# Patient Record
Sex: Female | Born: 1975 | Race: Black or African American | Hispanic: No | Smoking: Never smoker
Health system: Southern US, Community
[De-identification: ages and names within clinical notes are randomized; demographics above are authoritative.]

## PROBLEM LIST (undated history)

## (undated) DIAGNOSIS — Z789 Other specified health status: Secondary | ICD-10-CM

## (undated) DIAGNOSIS — IMO0002 Reserved for concepts with insufficient information to code with codable children: Secondary | ICD-10-CM

## (undated) DIAGNOSIS — N852 Hypertrophy of uterus: Secondary | ICD-10-CM

## (undated) DIAGNOSIS — N760 Acute vaginitis: Secondary | ICD-10-CM

## (undated) DIAGNOSIS — B009 Herpesviral infection, unspecified: Secondary | ICD-10-CM

## (undated) DIAGNOSIS — B9689 Other specified bacterial agents as the cause of diseases classified elsewhere: Secondary | ICD-10-CM

## (undated) DIAGNOSIS — N946 Dysmenorrhea, unspecified: Secondary | ICD-10-CM

## (undated) DIAGNOSIS — A749 Chlamydial infection, unspecified: Secondary | ICD-10-CM

## (undated) DIAGNOSIS — L0390 Cellulitis, unspecified: Secondary | ICD-10-CM

## (undated) HISTORY — DX: Hypertrophy of uterus: N85.2

## (undated) HISTORY — PX: OTHER SURGICAL HISTORY: SHX169

## (undated) HISTORY — PX: TUBAL LIGATION: SHX77

## (undated) HISTORY — DX: Chlamydial infection, unspecified: A74.9

## (undated) HISTORY — DX: Dysmenorrhea, unspecified: N94.6

## (undated) HISTORY — DX: Other specified bacterial agents as the cause of diseases classified elsewhere: N76.0

## (undated) HISTORY — DX: Herpesviral infection, unspecified: B00.9

## (undated) HISTORY — DX: Cellulitis, unspecified: L03.90

## (undated) HISTORY — PX: ABDOMINAL HYSTERECTOMY: SHX81

## (undated) HISTORY — DX: Acute vaginitis: B96.89

## (undated) HISTORY — DX: Reserved for concepts with insufficient information to code with codable children: IMO0002

---

## 1998-08-04 ENCOUNTER — Other Ambulatory Visit: Admission: RE | Admit: 1998-08-04 | Discharge: 1998-08-04 | Payer: Self-pay | Admitting: Obstetrics & Gynecology

## 1998-09-09 ENCOUNTER — Ambulatory Visit (HOSPITAL_COMMUNITY): Admission: RE | Admit: 1998-09-09 | Discharge: 1998-09-09 | Payer: Self-pay | Admitting: Obstetrics & Gynecology

## 1999-04-05 ENCOUNTER — Other Ambulatory Visit: Admission: RE | Admit: 1999-04-05 | Discharge: 1999-04-05 | Payer: Self-pay | Admitting: Obstetrics and Gynecology

## 1999-08-08 ENCOUNTER — Encounter (INDEPENDENT_AMBULATORY_CARE_PROVIDER_SITE_OTHER): Payer: Self-pay | Admitting: Specialist

## 1999-08-08 ENCOUNTER — Other Ambulatory Visit: Admission: RE | Admit: 1999-08-08 | Discharge: 1999-08-08 | Payer: Self-pay | Admitting: Obstetrics & Gynecology

## 1999-09-12 ENCOUNTER — Ambulatory Visit (HOSPITAL_COMMUNITY): Admission: RE | Admit: 1999-09-12 | Discharge: 1999-09-12 | Payer: Self-pay | Admitting: Obstetrics & Gynecology

## 1999-09-12 ENCOUNTER — Encounter (INDEPENDENT_AMBULATORY_CARE_PROVIDER_SITE_OTHER): Payer: Self-pay | Admitting: Specialist

## 2000-02-03 ENCOUNTER — Other Ambulatory Visit: Admission: RE | Admit: 2000-02-03 | Discharge: 2000-02-03 | Payer: Self-pay | Admitting: Obstetrics & Gynecology

## 2000-03-12 ENCOUNTER — Emergency Department (HOSPITAL_COMMUNITY): Admission: EM | Admit: 2000-03-12 | Discharge: 2000-03-12 | Payer: Self-pay | Admitting: Emergency Medicine

## 2000-06-04 ENCOUNTER — Other Ambulatory Visit: Admission: RE | Admit: 2000-06-04 | Discharge: 2000-06-04 | Payer: Self-pay | Admitting: Obstetrics & Gynecology

## 2000-08-24 ENCOUNTER — Inpatient Hospital Stay (HOSPITAL_COMMUNITY): Admission: AD | Admit: 2000-08-24 | Discharge: 2000-08-28 | Payer: Self-pay

## 2001-09-09 ENCOUNTER — Other Ambulatory Visit: Admission: RE | Admit: 2001-09-09 | Discharge: 2001-09-09 | Payer: Self-pay | Admitting: Obstetrics and Gynecology

## 2001-09-18 ENCOUNTER — Emergency Department (HOSPITAL_COMMUNITY): Admission: EM | Admit: 2001-09-18 | Discharge: 2001-09-18 | Payer: Self-pay | Admitting: Emergency Medicine

## 2002-06-02 ENCOUNTER — Ambulatory Visit (HOSPITAL_BASED_OUTPATIENT_CLINIC_OR_DEPARTMENT_OTHER): Admission: RE | Admit: 2002-06-02 | Discharge: 2002-06-02 | Payer: Self-pay | Admitting: Specialist

## 2002-06-02 ENCOUNTER — Encounter (INDEPENDENT_AMBULATORY_CARE_PROVIDER_SITE_OTHER): Payer: Self-pay | Admitting: Specialist

## 2002-09-11 ENCOUNTER — Other Ambulatory Visit: Admission: RE | Admit: 2002-09-11 | Discharge: 2002-09-11 | Payer: Self-pay | Admitting: Obstetrics and Gynecology

## 2002-09-11 ENCOUNTER — Other Ambulatory Visit: Admission: RE | Admit: 2002-09-11 | Discharge: 2002-09-11 | Payer: Self-pay | Admitting: *Deleted

## 2003-02-02 ENCOUNTER — Ambulatory Visit (HOSPITAL_BASED_OUTPATIENT_CLINIC_OR_DEPARTMENT_OTHER): Admission: RE | Admit: 2003-02-02 | Discharge: 2003-02-02 | Payer: Self-pay | Admitting: Specialist

## 2003-02-02 ENCOUNTER — Encounter (INDEPENDENT_AMBULATORY_CARE_PROVIDER_SITE_OTHER): Payer: Self-pay | Admitting: *Deleted

## 2003-06-08 ENCOUNTER — Encounter (INDEPENDENT_AMBULATORY_CARE_PROVIDER_SITE_OTHER): Payer: Self-pay | Admitting: *Deleted

## 2003-06-08 ENCOUNTER — Ambulatory Visit (HOSPITAL_BASED_OUTPATIENT_CLINIC_OR_DEPARTMENT_OTHER): Admission: RE | Admit: 2003-06-08 | Discharge: 2003-06-08 | Payer: Self-pay | Admitting: Specialist

## 2003-09-15 ENCOUNTER — Other Ambulatory Visit: Admission: RE | Admit: 2003-09-15 | Discharge: 2003-09-15 | Payer: Self-pay | Admitting: Obstetrics and Gynecology

## 2004-10-18 ENCOUNTER — Other Ambulatory Visit: Admission: RE | Admit: 2004-10-18 | Discharge: 2004-10-18 | Payer: Self-pay | Admitting: Obstetrics and Gynecology

## 2004-10-19 ENCOUNTER — Other Ambulatory Visit: Admission: RE | Admit: 2004-10-19 | Discharge: 2004-10-19 | Payer: Self-pay | Admitting: Obstetrics and Gynecology

## 2005-10-25 ENCOUNTER — Other Ambulatory Visit: Admission: RE | Admit: 2005-10-25 | Discharge: 2005-10-25 | Payer: Self-pay | Admitting: Obstetrics and Gynecology

## 2006-03-08 ENCOUNTER — Encounter: Admission: RE | Admit: 2006-03-08 | Discharge: 2006-03-08 | Payer: Self-pay | Admitting: General Surgery

## 2006-03-30 ENCOUNTER — Encounter (INDEPENDENT_AMBULATORY_CARE_PROVIDER_SITE_OTHER): Payer: Self-pay | Admitting: *Deleted

## 2006-03-30 ENCOUNTER — Ambulatory Visit (HOSPITAL_BASED_OUTPATIENT_CLINIC_OR_DEPARTMENT_OTHER): Admission: RE | Admit: 2006-03-30 | Discharge: 2006-03-30 | Payer: Self-pay | Admitting: General Surgery

## 2006-04-01 ENCOUNTER — Emergency Department (HOSPITAL_COMMUNITY): Admission: EM | Admit: 2006-04-01 | Discharge: 2006-04-02 | Payer: Self-pay | Admitting: Emergency Medicine

## 2006-04-07 ENCOUNTER — Emergency Department (HOSPITAL_COMMUNITY): Admission: EM | Admit: 2006-04-07 | Discharge: 2006-04-07 | Payer: Self-pay | Admitting: Emergency Medicine

## 2009-06-23 ENCOUNTER — Encounter: Admission: RE | Admit: 2009-06-23 | Discharge: 2009-06-23 | Payer: Self-pay | Admitting: Obstetrics and Gynecology

## 2009-08-26 ENCOUNTER — Encounter (INDEPENDENT_AMBULATORY_CARE_PROVIDER_SITE_OTHER): Payer: Self-pay | Admitting: Obstetrics and Gynecology

## 2009-08-26 ENCOUNTER — Inpatient Hospital Stay (HOSPITAL_COMMUNITY): Admission: RE | Admit: 2009-08-26 | Discharge: 2009-08-29 | Payer: Self-pay | Admitting: Obstetrics and Gynecology

## 2009-10-09 DIAGNOSIS — L0390 Cellulitis, unspecified: Secondary | ICD-10-CM

## 2009-10-09 HISTORY — DX: Cellulitis, unspecified: L03.90

## 2009-10-09 HISTORY — PX: REVISION OF ABDOMINAL SCAR: SHX2347

## 2010-11-25 ENCOUNTER — Encounter (HOSPITAL_COMMUNITY)
Admission: RE | Admit: 2010-11-25 | Discharge: 2010-11-25 | Disposition: A | Discharge status: Home / Self Care | Payer: BC Managed Care – PPO | Source: Ambulatory Visit | Attending: Obstetrics and Gynecology | Admitting: Obstetrics and Gynecology

## 2010-11-25 DIAGNOSIS — Z01812 Encounter for preprocedural laboratory examination: Secondary | ICD-10-CM | POA: Insufficient documentation

## 2010-11-25 LAB — CBC
HCT: 37.9 % (ref 36.0–46.0)
Hemoglobin: 12.4 g/dL (ref 12.0–15.0)
MCH: 27.7 pg (ref 26.0–34.0)
Platelets: 282 10*3/uL (ref 150–400)
RBC: 4.47 MIL/uL (ref 3.87–5.11)
WBC: 6.4 10*3/uL (ref 4.0–10.5)

## 2010-11-25 LAB — SURGICAL PCR SCREEN
MRSA, PCR: NEGATIVE
Staphylococcus aureus: NEGATIVE

## 2010-12-01 ENCOUNTER — Other Ambulatory Visit: Payer: Self-pay | Admitting: Obstetrics and Gynecology

## 2010-12-01 ENCOUNTER — Ambulatory Visit (HOSPITAL_COMMUNITY)
Admission: RE | Admit: 2010-12-01 | Discharge: 2010-12-01 | Disposition: A | Discharge status: Home / Self Care | Payer: BC Managed Care – PPO | Source: Ambulatory Visit | Attending: Obstetrics and Gynecology | Admitting: Obstetrics and Gynecology

## 2010-12-01 DIAGNOSIS — O909 Complication of the puerperium, unspecified: Secondary | ICD-10-CM | POA: Insufficient documentation

## 2010-12-11 ENCOUNTER — Inpatient Hospital Stay (HOSPITAL_COMMUNITY)
Admission: AD | Admit: 2010-12-11 | Discharge: 2010-12-11 | Disposition: A | Discharge status: Home / Self Care | Payer: BC Managed Care – PPO | Source: Ambulatory Visit | Attending: Obstetrics and Gynecology | Admitting: Obstetrics and Gynecology

## 2010-12-11 DIAGNOSIS — O99893 Other specified diseases and conditions complicating puerperium: Secondary | ICD-10-CM | POA: Insufficient documentation

## 2010-12-11 DIAGNOSIS — L501 Idiopathic urticaria: Secondary | ICD-10-CM | POA: Insufficient documentation

## 2010-12-11 LAB — DIFFERENTIAL
Lymphs Abs: 1.4 10*3/uL (ref 0.7–4.0)
Monocytes Absolute: 0.4 10*3/uL (ref 0.1–1.0)
Monocytes Relative: 7 % (ref 3–12)
Neutrophils Relative %: 62 % (ref 43–77)

## 2010-12-11 LAB — CBC
HCT: 40.9 % (ref 36.0–46.0)
MCH: 28.2 pg (ref 26.0–34.0)

## 2010-12-24 NOTE — Op Note (Signed)
  NAME:  Judy Baird, FLAUM             ACCOUNT NO.:  [id number]  MEDICAL RECORD NO.:  [id number]           PATIENT TYPE:  O  LOCATION:  [facility]                          [facility]  PHYSICIAN:  Hal Morales, M.D.DATE OF BIRTH:  [birth year]  DATE OF PROCEDURE:  12/01/2010 DATE OF DISCHARGE:                              OPERATIVE REPORT   PREOPERATIVE DIAGNOSIS:  Cyclic incisional tenderness and drainage, rule out endometriosis.  POSTOPERATIVE DIAGNOSIS:  Sub-incisional subcutaneous scarring without specific mass, rule out endometriosis.  DISPOSITION OF SPECIMEN:  Pathology.  DESCRIPTION OF PROCEDURE:  The patient was taken to the operating room after appropriate identification and placed on the operating table. After the attainment of adequate general anesthesia, the abdomen was prepped with ChloraPrep and allowed to dry.  The abdomen was then draped as a sterile field.  Subcutaneous infiltration of the incision with 0.25% Marcaine for a total of 20 mL was undertaken.  The indurated macerated portion of the incision was excised and indurated subcutaneous tissue underneath excised likewise.  Hemostasis was achieved with Bovie cautery and irrigation.  A small superficial defect in the fascia was closed with a running suture of 0 Vicryl.  The subcutaneous tissue was then reapproximated with mattress sutures of 3-0 Vicryl to reduce tension on the skin incision.  The skin incision was then closed with a subcuticular suture of 3-0 Monocryl.  A sterile pressure dressing was applied and the patient awakened from general anesthesia and taken to the recovery room in satisfactory condition, having tolerated the procedure well with sponge and instrument counts correct.  Specimen to Pathology is incisional scar which had been excised.  DISCHARGE INSTRUCTIONS:  The patient was given printed instructions for laparoscopy, however, her wound care will include leaving her dressing in place for  36 hours.  She can then remove the dressing and shower. She is to pat the incision dry and then to use a blow dryer over the incision, dressing it with a very light dressing.  This is primarily for comfort in wearing her cloths, however, when she can leave the incision open to air dry that it is indeed preferable.  Any time that she notices moisture in that area, she is to use the hair dryer to blow dry that moisture.  She will follow up in 1-2 weeks in the office with Dr. Pennie Rushing.  DISCHARGE MEDICATIONS: 1. Ibuprofen 600 mg p.o. q.6 hours for 3 days and then q.6 h p.r.n.     pain. 2. Vicodin 1-2 p.o. q.4 hours p.r.n. severe pain.    Hal Morales, M.D.    VPH/MEDQ  D:  12/01/2010  T:  12/01/2010  Job:  [id number]  Electronically Signed by Dierdre Forth M.D. on 12/24/2010 07:12:41 PM

## 2010-12-24 NOTE — H&P (Signed)
NAME:  Judy Baird, Judy Baird             ACCOUNT NO.:  [id number]  MEDICAL RECORD NO.:  [id number]         PATIENT TYPE:  WAMB  LOCATION:                                [facility]  [facility]  PHYSICIAN:  Hal Morales, M.D.DATE OF BIRTH:  07-Feb-1976  DATE OF ADMISSION:  12/01/2010 DATE OF DISCHARGE:                             HISTORY & PHYSICAL   DATE OF OPERATION:  December 01, 2010.  DATE OF EXAMINATION:  November 10, 2010.  HISTORY OF PRESENT ILLNESS:  The patient is a 34 year old black married female, para 2-0-0-2 who presents for evaluation of continued pain and drainage 16 months after cesarean section.  The patient underwent a repeat low transverse cesarean section in November 2010 without difficulty.  Shortly thereafter, she began to have some drainage from her incision.  There was no apparent defect in the incision; however, local cleaning was performed, and the patient was to continue with the cleaning-drying regimen.  She was seen on several occasions, approximately  1 week apart with what appeared to be good healing of the incision, but continued complaints of draining from the incision.  She had her first normal menstrual cycle on September 30, 2009, for approximately 4 days.  There was noted to be some slight moisture near her incision, but no specific defect in the healing.  She continued to monitor this, and then in January 2011, complained of some areas of soreness, but no further drainage.  She continued to have regular normal menses, but intermittent left lower quadrant pain; unassociated with nausea, vomiting, constipation or diarrhea.  Evaluation of the incision shows that it was completely closed with no drainage, but a very superficial area in the left pole that revealed some skin disruption. There was some induration below the incision.  She was given a 7-day course of Cipro.  She reported that her left lower quadrant pain was completely resolved and that her  menses continued to be normal.  When she was seen almost a year later, she reported having continued problems with her incision with monthly episodes of pain in the incision along with some clear drainage.  She stated that she had had increased pain in that area, especially during her menses over the central portion of her incision.  Further questioning of her past history revealed that she had had 6 years of no conception and no contraception use and had always had severe menstrual cramps.  Concerns about endometriosis were verbalized, and the patient underwent an ultrasound of the incisional area with no finding of any sort of collection of blood or fluid.  She continues to have nausea, vomiting, and severe cramps with her menses and states that her incisional pain is at its worst during her menses.  PAST OBSTETRICAL HISTORY:  The patient had a primary low transverse cesarean section in 2001 for nonreassuring fetal heart rate tracing at approximately 7 cm.  She had a repeat cesarean section in November 2010 as an elective scheduled repeat.  She also had a tubal ligation at that time for sterilization.  GYNECOLOGIC HISTORY:  The patient had a Pap smear showing CIN-2 in 1999 and underwent a  LEEP procedure for that.  Her Pap smears since that time have been normal.  The patient also has a history of Chlamydia treated in 2003.  She denies any other gynecologic illness.  The patient has a history of HSV-2 with infrequent outbreaks.  SURGICAL HISTORY:  In 1996, ACL reconstruction; 1999, arthroscopic knee evaluation on the right.  MEDICAL HISTORY:  The patient has a history of kidney stones with no recent episodes of pain.  The patient also has a history of gestational diabetes with her most recent pregnancy.  FAMILY HISTORY:  Positive for diabetes, hypertension, and cancer.  REVIEW OF SYSTEMS:  As mentioned above, with special attention to dysmenorrhea and cyclic incisional  pain.  PHYSICAL EXAMINATION:  GENERAL:  The patient is a well-developed black female in no acute distress. VITAL SIGNS:  The blood pressure is 102/64. LUNGS:  Clear. HEART:  Regular rate and rhythm. ABDOMEN:  Soft without masses or organomegaly.  There is tenderness over a well-healed suprapubic incision, primarily centrally.  There is no discharge from the incision. PELVIC:  EGBUS within normal limits.  The vagina is rugous.  The cervix is without gross lesions.  The uterus is normal size, shape, consistency, anterior, mobile, and nontender.  Adnexa, no masses.  IMPRESSION: 1. Cyclic menstrual incisional pain without evidence of subcutaneous     mass. 2. Severe dysmenorrhea. 3. Episodes of intermittent drainage from the incision, though none     recent.  DISPOSITION:  A long discussion was held with the patient concerning options for management of her symptoms.  The option of utilization of Mirena for control of dysmenorrhea and observation as to whether this resolved the cyclic incisional pain was discussed.  The patient was also offered a second opinion with Plastic Surgery to determine whether reconstruction of her incision is likely to lead to improvement.  She was also given the option of incisional exploration with excision of any mass and revision of the incision under general anesthesia at same-day surgery.  She had opted for this procedure and will undergo that at [facility] on December 01, 2010.  She understands the risks of anesthesia, bleeding, infection, and damage to adjacent organs.  She also understands the risk that this may not completely resolve the symptoms that she is having.  She does want to proceed.     Hal Morales, M.D.     VPH/MEDQ  D:  11/25/2010  T:  11/26/2010  Job:  [id number]  Electronically Signed by Dierdre Forth M.D. on 12/24/2010 07:12:33 PM

## 2010-12-27 ENCOUNTER — Emergency Department (HOSPITAL_COMMUNITY): Payer: BC Managed Care – PPO

## 2010-12-27 ENCOUNTER — Observation Stay (HOSPITAL_COMMUNITY)
Admission: EM | Admit: 2010-12-27 | Discharge: 2010-12-27 | Disposition: A | Discharge status: Home / Self Care | Payer: BC Managed Care – PPO | Source: Ambulatory Visit | Attending: Emergency Medicine | Admitting: Emergency Medicine

## 2010-12-27 DIAGNOSIS — R112 Nausea with vomiting, unspecified: Principal | ICD-10-CM | POA: Insufficient documentation

## 2010-12-27 LAB — CBC
HCT: 37.7 % (ref 36.0–46.0)
Hemoglobin: 12.9 g/dL (ref 12.0–15.0)
MCH: 28.4 pg (ref 26.0–34.0)
RDW: 12.8 % (ref 11.5–15.5)
WBC: 9.1 10*3/uL (ref 4.0–10.5)

## 2010-12-27 LAB — DIFFERENTIAL
Eosinophils Absolute: 0.1 10*3/uL (ref 0.0–0.7)
Eosinophils Relative: 1 % (ref 0–5)
Lymphs Abs: 1.9 10*3/uL (ref 0.7–4.0)
Monocytes Absolute: 0.9 10*3/uL (ref 0.1–1.0)

## 2010-12-27 LAB — COMPREHENSIVE METABOLIC PANEL
ALT: <8 U/L (ref 0–35)
Albumin: 4 g/dL (ref 3.5–5.2)
Alkaline Phosphatase: 81 U/L (ref 39–117)
BUN: 7 mg/dL (ref 6–23)
CO2: 23 mEq/L (ref 19–32)
Calcium: 9.5 mg/dL (ref 8.4–10.5)
Chloride: 108 mEq/L (ref 96–112)
GFR calc Af Amer: >60 mL/min (ref >60)
GFR calc non Af Amer: >60 mL/min (ref >60)
Glucose, Bld: 126 mg/dL — ABNORMAL HIGH (ref 70–99)
Sodium: 140 mEq/L (ref 135–145)
Total Bilirubin: 0.7 mg/dL (ref 0.3–1.2)
Total Protein: 8.1 g/dL (ref 6.0–8.3)

## 2010-12-27 LAB — URINE MICROSCOPIC-ADD ON

## 2010-12-27 LAB — LIPASE, BLOOD: Lipase: 22 U/L (ref 11–59)

## 2010-12-27 LAB — URINALYSIS, ROUTINE W REFLEX MICROSCOPIC
Specific Gravity, Urine: 1.005 (ref 1.005–1.030)
Urobilinogen, UA: 0.2 mg/dL (ref 0.0–1.0)
pH: 8 (ref 5.0–8.0)

## 2010-12-27 MED ORDER — IOHEXOL 300 MG/ML  SOLN
80.0000 mL | Freq: Once | INTRAMUSCULAR | Status: AC | PRN
  Administered 2010-12-27: 80 mL via INTRAVENOUS

## 2010-12-28 ENCOUNTER — Encounter (HOSPITAL_BASED_OUTPATIENT_CLINIC_OR_DEPARTMENT_OTHER): Payer: BC Managed Care – PPO

## 2011-01-11 LAB — CBC
HCT: 29 % — ABNORMAL LOW (ref 36.0–46.0)
HCT: 33.4 % — ABNORMAL LOW (ref 36.0–46.0)
Hemoglobin: 10.9 g/dL — ABNORMAL LOW (ref 12.0–15.0)
Hemoglobin: 9.5 g/dL — ABNORMAL LOW (ref 12.0–15.0)
MCV: 85.9 fL (ref 78.0–100.0)
Platelets: 279 10*3/uL (ref 150–400)
RBC: 3.35 MIL/uL — ABNORMAL LOW (ref 3.87–5.11)
RDW: 15.5 % (ref 11.5–15.5)
RDW: 15.6 % — ABNORMAL HIGH (ref 11.5–15.5)

## 2011-01-11 LAB — GLUCOSE, CAPILLARY
Glucose-Capillary: 71 mg/dL (ref 70–99)
Glucose-Capillary: 71 mg/dL (ref 70–99)
Glucose-Capillary: 74 mg/dL (ref 70–99)
Glucose-Capillary: 79 mg/dL (ref 70–99)

## 2011-01-11 LAB — BASIC METABOLIC PANEL
CO2: 25 mEq/L (ref 19–32)
Calcium: 8.8 mg/dL (ref 8.4–10.5)
Creatinine, Ser: 0.55 mg/dL (ref 0.4–1.2)
GFR calc Af Amer: >60 mL/min (ref >60)
Glucose, Bld: 56 mg/dL — ABNORMAL LOW (ref 70–99)

## 2011-01-11 LAB — RPR: RPR Ser Ql: NONREACTIVE

## 2011-01-25 ENCOUNTER — Encounter (HOSPITAL_BASED_OUTPATIENT_CLINIC_OR_DEPARTMENT_OTHER): Payer: BC Managed Care – PPO

## 2011-02-01 ENCOUNTER — Encounter (HOSPITAL_BASED_OUTPATIENT_CLINIC_OR_DEPARTMENT_OTHER): Payer: BC Managed Care – PPO | Attending: Plastic Surgery

## 2011-02-01 DIAGNOSIS — L905 Scar conditions and fibrosis of skin: Secondary | ICD-10-CM | POA: Insufficient documentation

## 2011-02-01 DIAGNOSIS — O909 Complication of the puerperium, unspecified: Secondary | ICD-10-CM | POA: Insufficient documentation

## 2011-02-10 ENCOUNTER — Encounter (HOSPITAL_BASED_OUTPATIENT_CLINIC_OR_DEPARTMENT_OTHER): Payer: BC Managed Care – PPO | Attending: Plastic Surgery

## 2011-02-10 DIAGNOSIS — L905 Scar conditions and fibrosis of skin: Secondary | ICD-10-CM | POA: Insufficient documentation

## 2011-02-10 DIAGNOSIS — O909 Complication of the puerperium, unspecified: Secondary | ICD-10-CM | POA: Insufficient documentation

## 2011-02-10 NOTE — H&P (Signed)
  NAME:  Judy Baird, Judy Baird             ACCOUNT NO.:  [id number]  MEDICAL RECORD NO.:  [id number]           PATIENT TYPE:  O  LOCATION:  [facility]                         FACILITY:  [facility]  PHYSICIAN:  Wayland Denis, DO      DATE OF BIRTH:  1976/10/02  DATE OF ADMISSION:  02/01/2011 DATE OF DISCHARGE:                             HISTORY & PHYSICAL   CHIEF COMPLAINT:  C-section wound.  HISTORY OF PRESENT ILLNESS:  Judy Baird is a 35 year old black female who underwent a C-section a year ago and then a revision in February 2012. Since then in the central portion of the incision, there has been an open wound.  She has tried putting different creams on that without improvement and is here for evaluation and suggestions for treatment.  PAST SURGICAL HISTORY: 1. Kidney stones. 2. C-section. 3. Hidradenitis in her axilla. 4. ACL repair.  MEDICATIONS:  Ibuprofen as needed.  ALLERGIES:  No known drug allergies.  FAMILY HISTORY:  Lives at home, has children, and did have gestational diabetes.FAMILY HISTORY:  Noncontributory.  REVIEW OF SYSTEMS:  She denies any recent fevers, infections, change in her vision, blood in her urine or stool, abdominal pain, nausea, vomiting, diarrhea, chest pain, or difficulty breathing.  PHYSICAL EXAMINATION:  GENERAL:  She is alert, oriented, and cooperative in no acute distress and a good historian.  She is pleasant. HEENT:  Her pupils are equal, reactive to light and accommodation. Extraocular muscles are intact. NECK:  No cervical lymphadenopathy. LUNGS:  Clear. HEART:  Regular. ABDOMEN:  Soft and nontender.  The incision for the C-section is healing.  In the very center portion of the incision, there is a 2-mm open area with some thin skin and epithelium around it.  It does not appear to be infected.  Because of the way her abdomen is and the scar, there is some folding over of the tissue which is probably keeping the area moist.  Additionally, any stretch  may be pulling that apart and making it difficult to heal. EXTREMITIES:  Lower extremity pulses are regular.  Good capillary refill.  No deformities noted.  LABORATORY DATA:  From January 25, 2011 is within normal limits.  For her CMP, her hemoglobin A1c is elevated at 5.8.  ASSESSMENT:  Abdominal Cesarean section scar.  PLAN:  Silvercel and triple antibiotic ointments, multivitamin, vitamin C, Dial soap bath twice a day or shower, and follow up in 1 week.     Wayland Denis, DO     CS/MEDQ  D:  02/01/2011  T:  02/02/2011  Job:  [id number]  Electronically Signed by Wayland Denis  on 02/10/2011 01:49:36 PM

## 2011-02-24 NOTE — Op Note (Signed)
[facility]  Patient:    Judy Baird, Judy Baird                        MRN: [id number] Proc. Date: 08/25/00 Attending:  Erie Noe P. Pennie Rushing, M.D.                           Operative Report  PREOPERATIVE DIAGNOSIS:       Failure to progress in labor and nonreassuring                               fetal heart rate tracing.  POSTOPERATIVE DIAGNOSIS:      Failure to progress in labor and nonreassuring                               fetal heart rate tracing.  OPERATION:                    Primary low transverse cesarean section.  SURGEON:                      Vanessa P. Pennie Rushing, M.D.  ASSISTANTVance Gather Duplantis, C.N.M.  ANESTHESIA:                   Epidural.  ESTIMATED BLOOD LOSS:         750 cc.  COMPLICATIONS:                None.  FINDINGS:                     The patient was delivered of a female infant weighing 7 pounds and 2 ounces with Apgars of 9 and 9 at one and five minutes respectively.  DESCRIPTION OF PROCEDURE:     The patient was taken to the operating room after appropriate identification and placed on the operating table.  After the obtainment of adequate epidural anesthesia, she was placed in the supine position with a left lateral tilt.  The abdomen was prepped with multiple layers of Betadine and draped as a sterile field.  A Foley catheter had been in place from the labor area.  A transverse incision was made in the abdomen and the abdomen opened in layers.  The peritoneum was entered and the visceroperitoneum overlying the uterus incised, and that incision taken laterally on either side.  The bladder flap was bluntly developed and the bladder blade placed.  The uterus was incised in the midline and that incision taken laterally on either side bluntly.  The infant was delivered from the occiput anterior position with the aid of first of Mityvac vacuum extractor and then the Kiwi vacuum extractor to complete the  delivery of the head.  The remainder of the infant was delivered and the nares and pharynx suctioned, then the cord clamped and cut, and the infant handed off to the awaiting pediatricians.  The appropriate cord blood was drawn and the placenta spontaneously separated from the uterus and was lifted from the operative field.  The uterine incision was closed with a running interlocking suture of 0 Vicryl.  An imbricating suture of 0 Vicryl was placed.  Hemostasis was noted to be  adequate.  The bladder flap was repaired with a figure-of-eight suture of 2-0 Vicryl. Copious irrigation was carried out and hemostasis noted to be adequate.  The abdominal peritoneum was closed with a running suture of 2-0 Vicryl.  The rectus muscles were reapproximated in the midline with a figure-of-eight suture of 2-0 Vicryl.  The rectus fascia was then closed with a running suture of 0 Vicryl, then reinforced on either side of the midline with figure-of-eight sutures of 0 Vicryl.  The subcutaneous tissue was irrigated and made hemostatic with Bovie cautery.  Skin staples were applied to the skin incision.  A sterile dressing was applied and the patient taken from the operating room to the recovery room in satisfactory condition having tolerated the procedure well with sponge and instrument counts correct. DD:  08/25/00 TD:  08/25/00 Job: [id number]

## 2011-02-24 NOTE — Discharge Summary (Signed)
[facility]  Patient:    Judy Baird, Judy Baird                      MRN: [id number] Adm. Date:  [id number] Disc. Date: [id number] Attending:  Shaune Spittle Dictator:   Miguel Dibble, C.N.M.                           Discharge Summary  DATE OF BIRTH:                [birth year].  ADMISSION DIAGNOSES:          1. Intrauterine pregnancy at 41 weeks.                               2. Primigravida.                               3. Spontaneous rupture of membranes.                               4. Negative group beta streptococci.  DISCHARGE DIAGNOSES:          1. Intrauterine pregnancy at 41 weeks.                               2. Primigravida.                               3. Spontaneous rupture of membranes.                               4. Negative group beta streptococci.                               5. Delivered viable baby girl, Apgars 9 and 9,                                  weight 7 pounds 2 ounces.                               6. Failure to progress.                               7. Nonreassuring fetal heart tones.  PROCEDURES:                   1. Epidural anesthesia.                               2. Primary lower segment transverse cesarean                                  section.  3. Internal fetal monitoring.                               4. Pitocin augmentation.  HOSPITAL COURSE:              On August 24, 2000 at approximately 1115 hours, [name] a 35 year old gravida 1, para 0 at 41 weeks was admitted with spontaneous rupture of membranes approximately two hours earlier.  She had had a history of a LEEP procedure.  She started her labor at 2 cm, vertex, and -1.  She remained approximately a fingertip, completely effaced, vertex, and -3.  Cervix very posterior by 1400 hours.  An early epidural was obtained with which she received relief.  Her cervix progressed to 3 cm by 1455 hours.  By  1800 hours, cervix remained 3 cm.  By 1940 hours, she had been started on Pitocin augmentation.  [place] units 130 to 160. By 2030 hours, she was having good fetal heart rate variability, mild variables, [place] units 130 to 220.  By 2210 hours, cervix had changed from 7 to 8 cm, completely effaced, vertex, and 0 station.  Mild variables some with late return.  At 2350 hours, [place] units were greater than 260. She was having moderate variability, no accelerations, variables with late return after each contraction in the previous 15 minutes.  The decision was made after discussion with patient and family that they proceed with a primary cesarean section for nonreassuring fetal heart tones and failure to progress. She proceeded to have a viable baby girl, Iysis, weight 7 pounds 2 ounces, Apgars 9 and 9, uncomplicated cesarean section.  On postop day #0, on August 25, 2000, she was breast-feeding fairly well, wanted Depo-Provera preferably for contraception, was passing flatus, tolerating her regular diet and her dressing was dry and intact, her lungs clear.  On postop day #1, on August 26, 2000, she was afebrile, hemoglobin had dropped from 12.7 to 10.7, she was passing flatus, moderate lochia.  On postop day #2, on August 27, 2000, she was recovering satisfactorily, decided on Micronor instead of Depo-Provera for contraception, her incision was clean, dry, and intact, fundus firm, lochia small.  On postop day #3, she was recovering satisfactorily, breasts were filling and slightly engorged.  She was discharged home in stable condition after staples were removed and Steri-Strips applied.  DISCHARGE INSTRUCTIONS:       [facility] ______ .  DISCHARGE MEDICATIONS:        Motrin, Tylox, Micronor, Prenatal vitamins, over-the-counter iron.  DISCHARGE FOLLOW-UP:          In six weeks with [facility].DD: 08/28/00 TD:  08/30/00 Job: [id number]

## 2011-02-24 NOTE — Op Note (Signed)
NAME:  Judy Baird, Judy Baird             ACCOUNT NO.:  [id number]   MEDICAL RECORD NO.:  [id number]          PATIENT TYPE:  AMB   LOCATION:  [facility]                          FACILITY:  [facility]   PHYSICIAN:  Leonie Man, M.D.   DATE OF BIRTH:  1976-08-30   DATE OF PROCEDURE:  03/30/2006  DATE OF DISCHARGE:                                 OPERATIVE REPORT   PREOPERATIVE DIAGNOSIS:  Very extensive pilonidal cyst.   POSTOPERATIVE DIAGNOSIS:  Very extensive pilonidal cyst.   PROCEDURE:  Excision and marsupialization of pilonidal cyst.   SURGEON:  Dr. Lurene Shadow.   ASSISTANT:  OR nurse.   ANESTHESIA:  General.   HISTORY:  Ms. 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  This was treated with  antibiotics, and she was then brought to the operating room after the  inflammation has subsided for excision of the cyst.  The patient understands  the risks and potential benefits of surgery and gives her consent to same.   PROCEDURE:  Following induction of satisfactory general anesthesia, the  patient is placed and placed in the prone position.  The lumbosacral and  buttock area are prepped and draped to be included in a sterile operative  field.  Elliptical incision is made starting just below the pilonidal cyst  and extending up onto the lumbosacral area with a excision of the entire  cyst and its contents down to the presacral fascia.  The entire cyst is  removed in its entirety.  Hemostasis assured with electrocautery.  The skin  is then marsupialized down to the presacral fascia with interrupted sutures  of 0 Vicryl.  A Xeroform gauze is placed within the wound.  A Sterile  dressing is placed on the wound.  The anesthetic reversed and the patient  removed from the operating room to the recovery room in stable condition.  She tolerated the procedure well.      Leonie Man, M.D.  Electronically Signed     PB/MEDQ  D:  03/30/2006  T:  03/30/2006  Job:  [id number]

## 2011-02-24 NOTE — Op Note (Signed)
   NAME:  Judy Baird, Judy Baird                       ACCOUNT NO.:  [id number]   MEDICAL RECORD NO.:  [id number]                   PATIENT TYPE:  AMB   LOCATION:  [facility]                                  FACILITY:  [facility]   PHYSICIAN:  Earvin Hansen L. Shon Hough, M.D.           DATE OF BIRTH:  February 03, 1976   DATE OF PROCEDURE:  06/08/2003  DATE OF DISCHARGE:                                 OPERATIVE REPORT   OPERATION PERFORMED:  Wide excision of hidradenitis suppurativa involving  the left axillary regions, complex repair.   SURGEON:  Yaakov Guthrie. Shon Hough, M.D.   ANESTHESIA:  General.   DESCRIPTION OF PROCEDURE:  The patient underwent general anesthesia  intubated orally.  Prep was done to the left axillary arm areas and chest  using Betadine soap and solution and walled off with sterile towels and  drapes so as to make a sterile field.  Marking pen was used to outline all  the hair bearing area of the left axilla.  0.5% Xylocaine with epinephrine  was injected locally until 50mL 1:200,000 concentration.  Wide excision was  made of the lesions down to underlying superficial fascia.  Then using the  Bovie unit on coagulation, I was able to remove the area through the  superficial fascia.  Hemostasis was maintained with the Bovie unit on  coagulation and after this, the medial and lateral flaps were freed out  significantly so as to do a rotational flap advancement and closure with 2-0  Monocryl times two layers and the lower layer attached to the underlying  fascia.  Subdermal suture of 3-0 Monocryl, then a running subcuticular  stitch of 3-0 Monocryl. Half inch Steri-Strips and soft dressings were  applied to all the areas.  She withstood the procedures very well, was taken  to recovery in excellent condition.                                                Yaakov Guthrie. Shon Hough, M.D.    Cathie Hoops  D:  06/08/2003  T:  06/08/2003  Job:  [id number]

## 2011-02-24 NOTE — H&P (Signed)
[facility]  Patient:    Judy Baird, Judy Baird                        MRN: [id number] Adm. Date:  08/24/00 Attending:  Erie Noe P. Pennie Rushing, M.D. Dictator:   Miguel Dibble, C.N.M.                         History and Physical  DATE OF BIRTH:                11-14-1975  HISTORY:                      This is a 35 year old gravida 1, para 0, at [redacted] weeks gestation, who experienced a spontaneous rupture of membranes approximately 30 minutes prior to her examination, with clear fluid.  She a positive pooling Nitrazine and fern test.  The cervix was 2.0 cm, 90% effaced, vertex at -1, and she was having mild cramps.  She was admitted for labor management.  PRENATAL LABORATORY DATA:     Upon entering into the practice are as follows: Hemoglobin 12.2, hematocrit 35.5, platelets 366.  Blood type and RH O-positive, Rh antibodies negative.  Sickle cell trait negative.  VDRL nonreactive.  Rubella titer immune.  Hepatitis-B surface antigen negative. Group beta Streptococcus negative.  Maternal alpha fetoprotein within normal limits.  Glucose challenge test within normal limits.  Pap smear in August of 2001, within normal limits.  Gonorrhea and Chlamydia cultures in May 2001, negative.  She has had ultrasounds which agree with dates.  ALLERGIES:                    No known drug allergies.  PAST MEDICAL HISTORY:         History of cervical dysplasia, with a LEEP procedure, and cone biopsy performed in December 2000.  She was diagnosed with HPV.  Kidney stone diagnosed in July 2000, passed spontaneously.  APONEUROSIS reconstruction of right patella in October 1997.  FAMILY HISTORY:               Maternal aunt with insulin-dependent diabetes mellitus.  Maternal grandfather with prostate cancer.  GENETIC HISTORY:              Significant for maternal first cousin with sickle cell disease.  SOCIAL HISTORY:               Father of the baby is Jena Gauss, African-American of the [place] religion, a stable monogamous relationship. 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 The father of the baby is a Engineer, maintenance (IT) and works as a Occupational psychologist.  Denies smoking, alcohol, or drug abuse.  PHYSICAL EXAMINATION:  HEENT:                        Within normal limits.  LUNGS:                        Bilaterally clear.  HEART:                        Regular rate and rhythm.  ABDOMEN:                      Soft, nontender.  Mild irregular contractions. Fetal heart rate is reassuring.  PELVIC:                       Cervix is 2.0 cm, 90% effaced, vertex at -1. Clear pooling of amniotic fluid.  EXTREMITIES:                  Negative edema.  Deep tendon reflexes +1.  ASSESSMENT:                   1. Primigravida at 41 weeks, with negative group                                  beta Streptococcus, with ruptured membranes                                  in early labor.                               2. History of previous cervical dysplasia                                  with a LEEP procedure.                               3. History of kidney stones.  PLAN:                         Admit to labor and delivery.  Notify Shelly ________, Certified Nurse Midwife, and Dr. Maris Berger. Haygood of the admission and status.  Routine [facility] orders.  Anticipate a normal spontaneous vaginal delivery. DD:  08/24/00 TD:  08/24/00 Job: [id number]

## 2011-02-24 NOTE — Op Note (Signed)
   NAME:  Judy Baird, Judy Baird                       ACCOUNT NO.:  [id number]   MEDICAL RECORD NO.:  [id number]                   PATIENT TYPE:  AMB   LOCATION:  DSC                                  FACILITY:  [facility]   PHYSICIAN:  Earvin Hansen L. Shon Hough, M.D.           DATE OF BIRTH:  [birth year]   DATE OF PROCEDURE:  02/02/2003  DATE OF DISCHARGE:                                 OPERATIVE REPORT   INDICATIONS FOR PROCEDURE:  The patient is a 35 year old lady with a history  of hidradenitis suppurativa involving the right and left axillae. We are  going to do the right side today. She has had increased pain, discomfort,  eruptions, abscesses and multiple incisions and drainages of the areas.   PROCEDURE:  Excision of the hidradenitis on the right axilla with a Penni Bombard closure, complex.   SURGEON:  Yaakov Guthrie. Shon Hough, M.D.   ANESTHESIA:  General.   DESCRIPTION OF PROCEDURE:  The patient underwent general anesthesia and was  intubated orally. Prep was done to the right axillary arm and chest areas  using Betadine soap and solution and walled off with sterile towels and  drapes so as to make a sterile field. Then 0.5% Xylocaine with epinephrine  was injected locally. Preoperatively the excision of all hair-bearing areas  was drawn on a large ellipse.   A #15 blade was used to excise the outer boundaries of the ellipse, and then  using the Bovie and coagulation, I was able to dissect the tissues down to  the superficial fascia. Hemostasis was maintained with the Bovie  coagulation.   Irrigation was done and after proper irrigation, the medial and lateral  edges of the flaps were freed out at approximately 4 cm, and then  subcutaneous closure for a complex repair was done with 2-0 Monocryl x2  layers, a subdermal suture of 3-0 Monocryl and then a running subcuticular  suture of 3-0 Monocryl. Steri-Strips and a soft dressing were applied to all  the area including Xeroform, 4 x 4s,  ABDs and Hypofix tape.   She withstood the procedure very well and was taken to the recovery room in  excellent condition. Estimated blood loss was less than  50 cc. There were  no complications.                                               Yaakov Guthrie. Shon Hough, M.D.    Cathie Hoops  D:  02/02/2003  T:  02/02/2003  Job:  [id number]

## 2011-02-24 NOTE — Op Note (Signed)
   NAME:  Judy Baird, Judy Baird                         ACCOUNT NO.:  [id number]   MEDICAL RECORD NO.:  [id number]                   PATIENT TYPE:  AMB   LOCATION:  DSC                                  FACILITY:  [facility]   PHYSICIAN:  Earvin Hansen L. Shon Hough, M.D.           DATE OF BIRTH:  March 17, 1976   DATE OF PROCEDURE:  06/02/2002  DATE OF DISCHARGE:                                 OPERATIVE REPORT   A 35 year old lady with a gangrenous cyst involving her right dorsal wrist  causing increased pain and discomfort especially on flexion of the area.   PROCEDURE:  Excision of gangrenous cyst on the right dorsal wrist at the  wrist crease.   SURGEON:  Yaakov Guthrie. Shon Hough, M.D.   ASSISTANT:  Alethia Berthold, M.D.   ANESTHESIA:  Matt Holmes block.   DESCRIPTION OF PROCEDURE:  The patient was taken to the operating room and  placed on the operating table in the supine position.  Prep was done to the  right upper extremity using Betadine soap and solution, and walled off with  sterile towels and drapes so as to make a sterile field.  A transverse  incision was made across the mass and carried down through the skin and  superficial retinaculum down to underlying capsule using the [employer], we were able to dissect around the area clockwise and then make a  plane under it using the [place] enabling [place] to remove the gangrenous cyst in  total without rupture.  Hemostasis was maintained with the Bovie unit on  coagulation and then the skin was then reclosed with 3-0 Vicryl subcu and  then a running subcuticular stitch of 3-0 Vicryl.  1/4 inch Steri-Strips and  soft dressing were applied.  She withstood the procedure very well and was  taken to the recovery room in excellent condition.                                               Yaakov Guthrie. Shon Hough, M.D.    Cathie Hoops  D:  06/02/2002  T:  06/03/2002  Job:  [id number]

## 2011-02-24 NOTE — Op Note (Signed)
[facility]  Patient:    Judy Baird                       MRN: [id number] Proc. Date: 09/12/99 Adm. Date:  [id number] Attending:  Cleatrice Burke                           Operative Report  PREOPERATIVE DIAGNOSIS:       Recurrent CIN-2, human papillomavirus.  POSTOPERATIVE DIAGNOSIS:      Recurrent CIN-2, human papillomavirus.  OPERATION:                    LEEP.  Endocervical curettage.  SURGEON:                      Cecilio Asper, M.D.  ASSISTANT:  ANESTHESIA:                   Local.  ESTIMATED BLOOD LOSS:         Minimal.  COMPLICATIONS:                None.  INDICATIONS:                  The patient is a 35 year old who had a LEEP procedure for CIN-2 in December of 1999.  The patient represents with colposcopic directed biopsies consistent with CIN-2.  The patient also had HPV typing that showed high risk HPV.  She is therefore consented for a repeat LEEP procedure.  DESCRIPTION OF PROCEDURE:     The patient was brought to the operating room and  placed in the dorsal lithotomy position.  The LEEP speculum was placed inside of the vagina and the cervix was cleansed with Betadine.  It was injected with 10 c of 0.5% Marcaine with epinephrine.  The abnormal transformation zone was then highlighted with lugols solution.  The abnormal transformation zone was removed  with a 10 x 10 loupe electrode and tissue anterior and posterior to the transformation zone was removed as well.  An endocervical curettage was then performed.  The base of the cervix was made hemostatic with the ball cautery. he speculum was then removed.  The patient tolerated the procedure well.  She was taken to the recovery room in stable condition. DD:  09/12/99 TD:  09/12/99 Job: [id number]

## 2011-04-20 NOTE — Progress Notes (Signed)
[facility]  NAME:  KYMARI, LOLLIS                  ACCOUNT NO.:  MEDICAL RECORD NO.:  [id number]      DATE OF BIRTH:  May 11, 1976  PHYSICIAN:  Wayland Denis, DO            VISIT DATE:                                  OFFICE VISIT   Manal is a 35 year old black female who had abdominal wound after C- section.  She has been using Silvercel, Dial soap showers over the last week with very good improvement and is essentially healed with one pinpoint area that is still open.  We will continue with the current dressing change.  On exam, she is alert, oriented, and cooperative, no acute distress, very pleasant.  Lungs are clear.  Heart is regular.  Abdomen is soft. No sign of abscess or continued infection.  We would like to have her return in 1 week.  She wanted some family leave forms filled out and a return to work form that asks for certification by signing and that I have read the prescription which is not included, therefore I have stated that she will need to bring that with her or who ever put her out needs to fill that out.  I am happy to do it, but I will need the job description in order to certify that I have read it.  She understands this and said she will either bring it with her next week or ask her OB.     Wayland Denis, DO     CS/MEDQ  D:  02/22/2011  T:  02/23/2011  Job:  [id number]

## 2011-09-16 ENCOUNTER — Emergency Department (HOSPITAL_COMMUNITY)
Admission: EM | Admit: 2011-09-16 | Discharge: 2011-09-17 | Disposition: A | Discharge status: Home / Self Care | Payer: BC Managed Care – PPO | Attending: Emergency Medicine | Admitting: Emergency Medicine

## 2011-09-16 ENCOUNTER — Encounter: Payer: Self-pay | Admitting: *Deleted

## 2011-09-16 DIAGNOSIS — R000 Tachycardia, unspecified: Secondary | ICD-10-CM | POA: Insufficient documentation

## 2011-09-16 DIAGNOSIS — R5381 Other malaise: Secondary | ICD-10-CM | POA: Insufficient documentation

## 2011-09-16 DIAGNOSIS — M549 Dorsalgia, unspecified: Secondary | ICD-10-CM | POA: Insufficient documentation

## 2011-09-16 DIAGNOSIS — J3489 Other specified disorders of nose and nasal sinuses: Secondary | ICD-10-CM | POA: Insufficient documentation

## 2011-09-16 DIAGNOSIS — M2550 Pain in unspecified joint: Secondary | ICD-10-CM | POA: Insufficient documentation

## 2011-09-16 DIAGNOSIS — R197 Diarrhea, unspecified: Secondary | ICD-10-CM | POA: Insufficient documentation

## 2011-09-16 DIAGNOSIS — IMO0001 Reserved for inherently not codable concepts without codable children: Secondary | ICD-10-CM | POA: Insufficient documentation

## 2011-09-16 DIAGNOSIS — R112 Nausea with vomiting, unspecified: Secondary | ICD-10-CM | POA: Insufficient documentation

## 2011-09-16 DIAGNOSIS — F17200 Nicotine dependence, unspecified, uncomplicated: Secondary | ICD-10-CM | POA: Insufficient documentation

## 2011-09-16 DIAGNOSIS — R109 Unspecified abdominal pain: Secondary | ICD-10-CM | POA: Insufficient documentation

## 2011-09-16 DIAGNOSIS — R509 Fever, unspecified: Secondary | ICD-10-CM | POA: Insufficient documentation

## 2011-09-16 NOTE — ED Notes (Signed)
Patient with n/v and diarrhea since 3am today.

## 2011-09-17 LAB — COMPREHENSIVE METABOLIC PANEL
ALT: 7 U/L (ref 0–35)
AST: 21 U/L (ref 0–37)
Alkaline Phosphatase: 82 U/L (ref 39–117)
CO2: 23 mEq/L (ref 19–32)
Chloride: 106 mEq/L (ref 96–112)
Creatinine, Ser: 0.6 mg/dL (ref 0.50–1.10)
GFR calc non Af Amer: >90 mL/min (ref >90)
Potassium: 3.5 mEq/L (ref 3.5–5.1)
Total Bilirubin: 0.4 mg/dL (ref 0.3–1.2)

## 2011-09-17 LAB — URINE MICROSCOPIC-ADD ON

## 2011-09-17 LAB — URINALYSIS, ROUTINE W REFLEX MICROSCOPIC
Bilirubin Urine: NEGATIVE
Glucose, UA: NEGATIVE mg/dL
Hgb urine dipstick: NEGATIVE
Ketones, ur: >80 mg/dL — AB
Protein, ur: 30 mg/dL — AB
pH: 8 (ref 5.0–8.0)

## 2011-09-17 LAB — DIFFERENTIAL
Basophils Absolute: 0 10*3/uL (ref 0.0–0.1)
Lymphocytes Relative: 13 % (ref 12–46)
Monocytes Absolute: 0.5 10*3/uL (ref 0.1–1.0)
Monocytes Relative: 6 % (ref 3–12)
Neutro Abs: 6.7 10*3/uL (ref 1.7–7.7)
Neutrophils Relative %: 82 % — ABNORMAL HIGH (ref 43–77)

## 2011-09-17 LAB — CBC
HCT: 37.6 % (ref 36.0–46.0)
Hemoglobin: 13 g/dL (ref 12.0–15.0)
RDW: 12.7 % (ref 11.5–15.5)
WBC: 8.2 10*3/uL (ref 4.0–10.5)

## 2011-09-17 MED ORDER — SODIUM CHLORIDE 0.9 % IV BOLUS (SEPSIS)
2000.0000 mL | Freq: Once | INTRAVENOUS | Status: AC
  Administered 2011-09-17: 1000 mL via INTRAVENOUS

## 2011-09-17 MED ORDER — SODIUM CHLORIDE 0.9 % IV SOLN
INTRAVENOUS | Status: DC
  Administered 2011-09-17: 02:00:00 via INTRAVENOUS

## 2011-09-17 MED ORDER — ONDANSETRON HCL 4 MG/2ML IJ SOLN
4.0000 mg | Freq: Once | INTRAMUSCULAR | Status: AC
  Administered 2011-09-17: 4 mg via INTRAVENOUS
  Filled 2011-09-17: qty 2

## 2011-09-17 MED ORDER — METOCLOPRAMIDE HCL 5 MG/ML IJ SOLN
10.0000 mg | Freq: Once | INTRAMUSCULAR | Status: AC
  Administered 2011-09-17: 10 mg via INTRAVENOUS
  Filled 2011-09-17: qty 2

## 2011-09-17 MED ORDER — LOPERAMIDE HCL 2 MG PO CAPS: ORAL_CAPSULE | ORAL | Status: AC

## 2011-09-17 MED ORDER — METOCLOPRAMIDE HCL 10 MG PO TABS: 10.0000 mg | ORAL_TABLET | Freq: Four times a day (QID) | ORAL | Status: AC | PRN

## 2011-09-17 MED ORDER — HYDROMORPHONE HCL PF 1 MG/ML IJ SOLN
1.0000 mg | Freq: Once | INTRAMUSCULAR | Status: AC
  Administered 2011-09-17: 1 mg via INTRAVENOUS
  Filled 2011-09-17: qty 1

## 2011-09-17 MED ORDER — OXYCODONE-ACETAMINOPHEN 5-325 MG PO TABS: 2.0000 | ORAL_TABLET | ORAL | Status: AC | PRN

## 2011-09-17 MED ORDER — ONDANSETRON 8 MG PO TBDP: ORAL_TABLET | ORAL | Status: AC

## 2011-09-17 NOTE — ED Provider Notes (Signed)
History     CSN: [id number] Arrival date & time: 09/16/2011 10:48 PM   First MD Initiated Contact with Patient 09/17/11 0134      Chief Complaint  Patient presents with  . Emesis    (Consider location/radiation/quality/duration/timing/severity/associated sxs/prior treatment) HPI This 35 year old female has almost 24 hours of chills sweats body aches fatigue nausea vomiting and diarrhea and diffuse waxing and waning crampy sharp abdominal pain without radiation and without dysuria vaginal bleeding or vaginal discharge. She has some nasal congestion but no cough or shortness of breath. She has no confusion but she feels generally weak and lightheaded from all the vomiting and diarrhea she has had today. She has generalized body aches. She has not had any localized weakness. History reviewed. No pertinent past medical history.  History reviewed. No pertinent past surgical history.  History reviewed. No pertinent family history.  History  Substance Use Topics  . Smoking status: Passive Smoker  . Smokeless tobacco: Not on file  . Alcohol Use: Yes    OB History    Grav Para Term Preterm Abortions TAB SAB Ect Mult Living                  Review of Systems  Constitutional: Positive for fever, chills, appetite change and fatigue.       10 Systems reviewed and are negative for acute change except as noted in the HPI.  HENT: Positive for congestion and rhinorrhea.   Eyes: Negative for discharge and redness.  Respiratory: Negative for cough and shortness of breath.   Cardiovascular: Negative for chest pain.  Gastrointestinal: Positive for nausea, vomiting, abdominal pain and diarrhea. Negative for blood in stool.  Genitourinary: Negative for dysuria, vaginal bleeding and vaginal discharge.  Musculoskeletal: Positive for myalgias, back pain and arthralgias.  Skin: Negative for rash.  Neurological: Positive for weakness. Negative for syncope, numbness and headaches.    Psychiatric/Behavioral:       No behavior change.    Allergies  Review of patient's allergies indicates no known allergies.  Home Medications   Current Outpatient Rx  Name Route Sig Dispense Refill  . LOPERAMIDE HCL 2 MG PO CAPS  Take 2 tabs initially, then one tab po after each loose stool not to exceed 16mg  (8tabs) in 24 hours 12 capsule 0  . METOCLOPRAMIDE HCL 10 MG PO TABS Oral Take 1 tablet (10 mg total) by mouth every 6 (six) hours as needed (nausea/headache). 6 tablet 0  . ONDANSETRON 8 MG PO TBDP  8mg  ODT q4 hours prn nausea 4 tablet 0  . OXYCODONE-ACETAMINOPHEN 5-325 MG PO TABS Oral Take 2 tablets by mouth every 4 (four) hours as needed for pain. 10 tablet 0    BP 113/71  Pulse 82  Temp(Src) 98.2 F (36.8 C) (Oral)  Resp 18  Ht 5\' 2"  (1.575 m)  Wt 140 lb (63.504 kg)  BMI 25.61 kg/m2  SpO2 100%  Physical Exam  Nursing note and vitals reviewed. Constitutional:       Awake, alert, nontoxic appearance.  HENT:  Head: Atraumatic.  Mouth/Throat: No oropharyngeal exudate.       Mucous membranes somewhat dry  Eyes: Right eye exhibits no discharge. Left eye exhibits no discharge.  Neck: Neck supple.  Cardiovascular: Regular rhythm.   No murmur heard.      Tachycardic  Pulmonary/Chest: Effort normal. She exhibits no tenderness.  Abdominal: Soft. Bowel sounds are normal. She exhibits no mass. There is tenderness. There is no rebound and no guarding.  Mild diffuse tenderness  Musculoskeletal: She exhibits tenderness. She exhibits no edema.       Baseline ROM, no obvious new focal weakness. Back in all 4 extremities and mildly but diffusely tender  Neurological:       Mental status and motor strength appears baseline for patient and situation.  Skin: No rash noted.  Psychiatric: She has a normal mood and affect.    ED Course  Procedures (including critical care time) The patient feels much better after IV fluids and medications in the ED and wants to go  home.0530 Labs Reviewed  DIFFERENTIAL - Abnormal; Notable for the following:    Neutrophils Relative 82 (*)    All other components within normal limits  COMPREHENSIVE METABOLIC PANEL - Abnormal; Notable for the following:    Glucose, Bld 105 (*)    Total Protein 8.5 (*)    All other components within normal limits  URINALYSIS, ROUTINE W REFLEX MICROSCOPIC - Abnormal; Notable for the following:    APPearance CLOUDY (*)    Ketones, ur >80 (*)    Protein, ur 30 (*)    All other components within normal limits  URINE MICROSCOPIC-ADD ON - Abnormal; Notable for the following:    Bacteria, UA FEW (*)    All other components within normal limits  CBC  LIPASE, BLOOD  POCT PREGNANCY, URINE   No results found.   1. Abdominal pain   2. Vomiting and diarrhea       MDM  I doubt any other EMC precluding discharge at this time including, but not necessarily limited to the following:peritonitis, SBI.        Hurman Horn, MD 09/17/11 1501

## 2011-09-17 NOTE — ED Notes (Signed)
Pt updated on delay of care. Pt resting.

## 2011-09-17 NOTE — ED Notes (Signed)
Family at bedside. 

## 2011-09-17 NOTE — ED Notes (Signed)
Patient is resting comfortably. 

## 2011-09-17 NOTE — ED Notes (Signed)
Pt reports flu like symptoms and unable to keep foods down. Pt reports dry heaving and some diarrhea earlier this AM. PT has not treated symptoms OTC. PT reports midline abdominal pain.

## 2011-11-05 ENCOUNTER — Encounter (HOSPITAL_COMMUNITY): Payer: Self-pay | Admitting: Emergency Medicine

## 2011-11-05 ENCOUNTER — Emergency Department (HOSPITAL_COMMUNITY)
Admission: EM | Admit: 2011-11-05 | Discharge: 2011-11-05 | Disposition: A | Discharge status: Home / Self Care | Payer: BC Managed Care – PPO | Attending: Emergency Medicine | Admitting: Emergency Medicine

## 2011-11-05 DIAGNOSIS — R112 Nausea with vomiting, unspecified: Secondary | ICD-10-CM | POA: Insufficient documentation

## 2011-11-05 DIAGNOSIS — N898 Other specified noninflammatory disorders of vagina: Secondary | ICD-10-CM | POA: Insufficient documentation

## 2011-11-05 DIAGNOSIS — IMO0001 Reserved for inherently not codable concepts without codable children: Secondary | ICD-10-CM | POA: Insufficient documentation

## 2011-11-05 DIAGNOSIS — R61 Generalized hyperhidrosis: Secondary | ICD-10-CM | POA: Insufficient documentation

## 2011-11-05 DIAGNOSIS — R197 Diarrhea, unspecified: Secondary | ICD-10-CM | POA: Insufficient documentation

## 2011-11-05 DIAGNOSIS — R1084 Generalized abdominal pain: Secondary | ICD-10-CM | POA: Insufficient documentation

## 2011-11-05 LAB — POCT PREGNANCY, URINE: Preg Test, Ur: NEGATIVE

## 2011-11-05 LAB — URINALYSIS, ROUTINE W REFLEX MICROSCOPIC
Ketones, ur: NEGATIVE mg/dL
Leukocytes, UA: NEGATIVE
Protein, ur: NEGATIVE mg/dL
Urobilinogen, UA: 0.2 mg/dL (ref 0.0–1.0)

## 2011-11-05 LAB — URINE MICROSCOPIC-ADD ON

## 2011-11-05 MED ORDER — HYDROMORPHONE HCL PF 2 MG/ML IJ SOLN
2.0000 mg | Freq: Once | INTRAMUSCULAR | Status: AC
  Administered 2011-11-05: 2 mg via INTRAMUSCULAR
  Filled 2011-11-05: qty 1

## 2011-11-05 MED ORDER — ONDANSETRON 8 MG PO TBDP: 8.0000 mg | ORAL_TABLET | Freq: Three times a day (TID) | ORAL | Status: AC | PRN

## 2011-11-05 MED ORDER — OXYCODONE-ACETAMINOPHEN 5-325 MG PO TABS
2.0000 | ORAL_TABLET | Freq: Once | ORAL | Status: AC
  Administered 2011-11-05: 2 via ORAL
  Filled 2011-11-05: qty 2

## 2011-11-05 MED ORDER — ONDANSETRON HCL 4 MG/2ML IJ SOLN
4.0000 mg | Freq: Once | INTRAMUSCULAR | Status: AC
  Administered 2011-11-05: 4 mg via INTRAMUSCULAR

## 2011-11-05 MED ORDER — HYDROCODONE-ACETAMINOPHEN 5-325 MG PO TABS: 1.0000 | ORAL_TABLET | Freq: Four times a day (QID) | ORAL | Status: AC | PRN

## 2011-11-05 MED ORDER — ONDANSETRON 4 MG PO TBDP
8.0000 mg | ORAL_TABLET | Freq: Once | ORAL | Status: AC
  Administered 2011-11-05: 8 mg via ORAL
  Filled 2011-11-05: qty 2

## 2011-11-05 MED ORDER — ONDANSETRON HCL 4 MG/2ML IJ SOLN
4.0000 mg | Freq: Once | INTRAMUSCULAR | Status: DC
  Filled 2011-11-05: qty 2

## 2011-11-05 NOTE — ED Provider Notes (Signed)
Patient reports complete resolution of symptoms. Reports she is ready for discharge. Abdomen is nontender. Will discharge home with analgesics and antiemetics and his symptoms returned. Advised close followup for worsening symptoms and she female attorney. Patient agrees with plan and is requesting to go home.  Thomasene Lot, PA-C 11/05/11 1533

## 2011-11-05 NOTE — ED Provider Notes (Signed)
Medical screening examination/treatment/procedure(s) were performed by non-physician practitioner and as supervising physician I was immediately available for consultation/collaboration.   Yeriel Mineo A Bemnet Trovato, MD 11/05/11 2014 

## 2011-11-05 NOTE — ED Notes (Signed)
Pt reports onset 0600 today with nausea, vomiting, diarrhea and abdominal pain.

## 2011-11-05 NOTE — ED Provider Notes (Signed)
History     CSN: [id number]  Arrival date & time 11/05/11  1141   First MD Initiated Contact with Patient 11/05/11 1204      Chief Complaint  Patient presents with  . Nausea  . Emesis  . Diarrhea  . Abdominal Pain    (Consider location/radiation/quality/duration/timing/severity/associated sxs/prior treatment) HPI Comments: Patient presents with onset of nausea, vomiting, and diarrhea at 6 AM. Patient also reports diffuse abdominal pain with vomiting. Patient had similar symptoms one to 2 months ago which resolved. Patient denies sick contacts. Patient's husband had the same meal last evening and he is asymptomatic. No treatments prior to arrival. Patient with multiple episodes of vomiting and diarrhea morning. No urinary symptoms. No fever, upper respiratory tract infection symptoms. No history of abdominal surgery other than C-section. LMP is now.   Patient is a 36 y.o. female presenting with vomiting, diarrhea, and abdominal pain. The history is provided by the patient.  Emesis  This is a new problem. The current episode started 6 to 12 hours ago. The problem occurs 5 to 10 times per day. The problem has not changed since onset.The emesis has an appearance of bilious material and stomach contents. There has been no fever. Associated symptoms include abdominal pain, diarrhea, myalgias and sweats. Pertinent negatives include no chills, no cough, no fever, no headaches and no URI.  Diarrhea The primary symptoms include abdominal pain, nausea, vomiting, diarrhea and myalgias. Primary symptoms do not include fever, dysuria or rash.  The illness does not include chills or constipation.  Abdominal Pain The primary symptoms of the illness include abdominal pain, nausea, vomiting, diarrhea and vaginal bleeding. The primary symptoms of the illness do not include fever, shortness of breath or dysuria.  Symptoms associated with the illness do not include chills or constipation.    History  reviewed. No pertinent past medical history.  Past Surgical History  Procedure Date  . Cesarean section     History reviewed. No pertinent family history.  History  Substance Use Topics  . Smoking status: Never Smoker   . Smokeless tobacco: Not on file  . Alcohol Use: Yes    OB History    Grav Para Term Preterm Abortions TAB SAB Ect Mult Living                  Review of Systems  Constitutional: Negative for fever and chills.  HENT: Negative for sore throat and rhinorrhea.   Eyes: Negative for discharge.  Respiratory: Negative for cough and shortness of breath.   Cardiovascular: Negative for chest pain.  Gastrointestinal: Positive for nausea, vomiting, abdominal pain and diarrhea. Negative for constipation.  Genitourinary: Positive for vaginal bleeding. Negative for dysuria.  Musculoskeletal: Positive for myalgias.  Skin: Negative for rash.  Neurological: Negative for headaches.  Psychiatric/Behavioral: Negative for confusion.    Allergies  Review of patient's allergies indicates no known allergies.  Home Medications  No current outpatient prescriptions on file.  BP 138/92  Pulse 71  Temp(Src) 97.7 F (36.5 C) (Oral)  Resp 20  SpO2 100%  LMP 11/05/2011  Physical Exam  Nursing note and vitals reviewed. Constitutional: She is oriented to person, place, and time. She appears well-developed and well-nourished.  HENT:  Head: Normocephalic and atraumatic.  Eyes: Pupils are equal, round, and reactive to light. Right eye exhibits no discharge. Left eye exhibits no discharge.  Neck: Normal range of motion. Neck supple.  Cardiovascular: Normal rate and regular rhythm.   No murmur heard. Pulmonary/Chest: Effort  normal and breath sounds normal. No respiratory distress. She has no wheezes.  Abdominal: Soft. Bowel sounds are normal. There is generalized tenderness. There is no rebound, no guarding and negative Murphy's sign.  Musculoskeletal: Normal range of motion.    Neurological: She is alert and oriented to person, place, and time.  Skin: Skin is warm and dry. No rash noted.  Psychiatric: She has a normal mood and affect.    ED Course  Procedures (including critical care time)  Labs Reviewed  URINALYSIS, ROUTINE W REFLEX MICROSCOPIC - Abnormal; Notable for the following:    APPearance CLOUDY (*)    Hgb urine dipstick LARGE (*)    All other components within normal limits  URINE MICROSCOPIC-ADD ON - Abnormal; Notable for the following:    Squamous Epithelial / LPF FEW (*)    Bacteria, UA MANY (*)    All other components within normal limits  POCT PREGNANCY, URINE  POCT PREGNANCY, URINE   No results found.   1. Nausea vomiting and diarrhea     12:14 PM Patient seen and examined. Zofran ordered as IV. Pt is actively vomiting. UA ordered.   Patient reported improvement in nausea however was having pain. IM dilaudid ordered. Patient d/w Albertine Grates. Plan: control symptoms and d/c to home when she can tolerate fluids. D/c to home with nausea medication.   MDM  Patient with symptoms consistent with viral gastroenteritis.  Vitals are stable, no fever.  Lungs are clear.  No focal abdominal pain, no concern for appendicitis, cholecystitis, pancreatitis, ruptured viscus, UTI, kidney stone, or any other abdominal etiology.  Do not think more significant work-up is indicated at this point.  Supportive therapy indicated with return if symptoms worsen.         Eustace Moore Draper, [place] 11/05/11 1546

## 2011-11-05 NOTE — ED Provider Notes (Signed)
Medical screening examination/treatment/procedure(s) were performed by non-physician practitioner and as supervising physician I was immediately available for consultation/collaboration.   Nat Christen, MD 11/05/11 2013

## 2012-02-18 ENCOUNTER — Inpatient Hospital Stay (HOSPITAL_COMMUNITY)
Admission: AD | Admit: 2012-02-18 | Discharge: 2012-02-18 | Disposition: A | Discharge status: Home / Self Care | Payer: BC Managed Care – PPO | Source: Ambulatory Visit | Attending: Obstetrics and Gynecology | Admitting: Obstetrics and Gynecology

## 2012-02-18 ENCOUNTER — Encounter (HOSPITAL_COMMUNITY): Payer: Self-pay | Admitting: Obstetrics and Gynecology

## 2012-02-18 ENCOUNTER — Inpatient Hospital Stay (HOSPITAL_COMMUNITY): Payer: BC Managed Care – PPO

## 2012-02-18 DIAGNOSIS — R1084 Generalized abdominal pain: Secondary | ICD-10-CM

## 2012-02-18 DIAGNOSIS — R112 Nausea with vomiting, unspecified: Secondary | ICD-10-CM | POA: Insufficient documentation

## 2012-02-18 DIAGNOSIS — R109 Unspecified abdominal pain: Secondary | ICD-10-CM | POA: Insufficient documentation

## 2012-02-18 LAB — URINALYSIS, ROUTINE W REFLEX MICROSCOPIC
Bilirubin Urine: NEGATIVE
Specific Gravity, Urine: 1.02 (ref 1.005–1.030)
pH: 8 (ref 5.0–8.0)

## 2012-02-18 LAB — CBC
MCH: 28.4 pg (ref 26.0–34.0)
MCV: 84.6 fL (ref 78.0–100.0)
Platelets: 299 10*3/uL (ref 150–400)
RDW: 12.6 % (ref 11.5–15.5)
WBC: 4.4 10*3/uL (ref 4.0–10.5)

## 2012-02-18 LAB — DIFFERENTIAL
Basophils Absolute: 0 10*3/uL (ref 0.0–0.1)
Eosinophils Absolute: 0 10*3/uL (ref 0.0–0.7)
Eosinophils Relative: 0 % (ref 0–5)
Lymphocytes Relative: 34 % (ref 12–46)

## 2012-02-18 LAB — COMPREHENSIVE METABOLIC PANEL
ALT: 12 U/L (ref 0–35)
AST: 24 U/L (ref 0–37)
Albumin: 4 g/dL (ref 3.5–5.2)
Calcium: 9.7 mg/dL (ref 8.4–10.5)
Sodium: 140 mEq/L (ref 135–145)
Total Protein: 7.7 g/dL (ref 6.0–8.3)

## 2012-02-18 LAB — RAPID URINE DRUG SCREEN, HOSP PERFORMED
Amphetamines: NOT DETECTED
Cocaine: NOT DETECTED
Opiates: NOT DETECTED
Tetrahydrocannabinol: POSITIVE — AB

## 2012-02-18 LAB — URINE MICROSCOPIC-ADD ON

## 2012-02-18 LAB — WET PREP, GENITAL: Yeast Wet Prep HPF POC: NONE SEEN

## 2012-02-18 MED ORDER — PROMETHAZINE HCL 25 MG/ML IJ SOLN
25.0000 mg | Freq: Once | INTRAMUSCULAR | Status: AC
  Administered 2012-02-18: 12.5 mg via INTRAVENOUS
  Filled 2012-02-18: qty 1

## 2012-02-18 MED ORDER — HYDROMORPHONE HCL PF 1 MG/ML IJ SOLN
1.0000 mg | Freq: Once | INTRAMUSCULAR | Status: AC
  Administered 2012-02-18: 1 mg via INTRAVENOUS
  Filled 2012-02-18: qty 1

## 2012-02-18 MED ORDER — PROMETHAZINE HCL 25 MG PO TABS: 25.0000 mg | ORAL_TABLET | Freq: Four times a day (QID) | ORAL | Status: DC | PRN

## 2012-02-18 MED ORDER — LACTATED RINGERS IV SOLN: INTRAVENOUS | Status: DC

## 2012-02-18 MED ORDER — ONDANSETRON HCL 4 MG/2ML IJ SOLN
4.0000 mg | Freq: Once | INTRAMUSCULAR | Status: AC
  Administered 2012-02-18: 4 mg via INTRAVENOUS
  Filled 2012-02-18: qty 2

## 2012-02-18 NOTE — MAU Provider Note (Signed)
History     CSN: [id number]  Arrival date & time 02/18/12  1234   First Provider Initiated Contact with Patient 02/18/12 1319      Chief Complaint  Patient presents with  . Abdominal Pain    HPICC: severe ABd pain, nausea and vomiting  36 yr female, G2P2 cesarean section x 2 and BTL , LMP 4 weeks ago, awoke with severe abd pain and nausea and vomiting.  Denies fever, chills.  Last night felt well enough to "go out , and had a few drinks"([employer]), denies THC, or coke or other drugs or meds.  Accompanied by husband who says this is the most severe abd pain he has noted, and that patient has less severe pain just before and during her menses. Pain is noted several times when she drank. Denies THC or other rec drugs.  No past medical history on file.  Past Surgical History  Procedure Date  . Cesarean section    cesarean and tubal 2010  No family history on file.  History  Substance Use Topics  . Smoking status: Never Smoker   . Smokeless tobacco: Not on file  . Alcohol Use: Yes    OB History    Grav Para Term Preterm Abortions TAB SAB Ect Mult Living                  Review of Systems  Allergies  Review of patient's allergies indicates no known allergies.  Home Medications  No current outpatient prescriptions on file.  BP 145/98  Pulse 72  Temp(Src) 97.6 F (36.4 C) (Rectal)  Resp 22  Physical Exam Physical Examination: General appearance - alert, well appearing, and in no distress, ill-appearing, chronically ill appearing and having recurrent vomiting episodes Mental status - alert, oriented to person, place, and time, anxious, agitated Neck - supple, no significant adenopathy Chest - clear to auscultation, no wheezes, rales or rhonchi, symmetric air entry Heart - normal rate and regular rhythm Abdomen - soft, nontender, nondistended, no masses or organomegaly bowel sounds hypoactive no guarding or rebound   no bladder distension noted I& O cath to get urine  sample done Pelvic - normal external genitalia, vulva, vagina, cervix, uterus and adnexa, VULVA: normal appearing vulva with no masses, tenderness or lesions, VAGINA: normal appearing vagina with normal color and discharge, no lesions, CERVIX: normal appearing cervix without discharge or lesions, WET MOUNT done - results: pending, cervical motion tenderness absent,  , ADNEXA: {detv MAU Course  Procedures (including critical care time)   Labs Reviewed  CBC  DIFFERENTIAL  COMPREHENSIVE METABOLIC PANEL  URINALYSIS, ROUTINE W REFLEX MICROSCOPIC  HCG, QUANTITATIVE, PREGNANCY  URINE RAPID DRUG SCREEN (HOSP PERFORMED)   No results found.   No diagnosis found. Chronic Abd pain, acute exacerbation Absence of Abdominal paine indicates that pt likely to experience pain with peroids into future  Plan:  Hydration/antiemetics' followup prn. MDM

## 2012-02-18 NOTE — MAU Note (Addendum)
Pt reports severe abd pain that started this morning. Pt is very uncomfortable and cannot lay still. Pain is very cramping an throughout her lower abd. Pt stated she had similar pain last month and wen to [facility]  And she got some medication for pain ans it went away. Pt denies vag bleeding or discharge but does report having n/v all morning with the pain.

## 2012-02-18 NOTE — Progress Notes (Signed)
Patient feels much better after Phenergan IV. She tolerated p.o. Liquids and crackers.  Denies pain. 3way abdomen is normal.  Reviewing old record indicates that the pain she is having now is very similar pains, tho worse, to the pains that were present before her c-section cicatrix revision for suspected incisional pain.  The possibility of a single intraabdominal etiology may be worth evaluating with UGI/SBFT or similar tests.  Pt advised to followup with Drs. Familiar with her case.  Assess;  Resolved Nausea and vomiting Plan; discharge home          Rx Phenergan p.o.         F/u [facility] soon.

## 2012-06-03 ENCOUNTER — Ambulatory Visit: Payer: Self-pay | Admitting: Obstetrics and Gynecology

## 2012-06-26 ENCOUNTER — Ambulatory Visit (INDEPENDENT_AMBULATORY_CARE_PROVIDER_SITE_OTHER): Payer: 59 | Admitting: Obstetrics and Gynecology

## 2012-06-26 ENCOUNTER — Encounter: Payer: Self-pay | Admitting: Obstetrics and Gynecology

## 2012-06-26 VITALS — BP 102/66 | Ht 63.0 in | Wt 135.0 lb

## 2012-06-26 DIAGNOSIS — Z124 Encounter for screening for malignant neoplasm of cervix: Secondary | ICD-10-CM

## 2012-06-26 DIAGNOSIS — B009 Herpesviral infection, unspecified: Secondary | ICD-10-CM | POA: Insufficient documentation

## 2012-06-26 DIAGNOSIS — N946 Dysmenorrhea, unspecified: Secondary | ICD-10-CM | POA: Insufficient documentation

## 2012-06-26 DIAGNOSIS — N852 Hypertrophy of uterus: Secondary | ICD-10-CM | POA: Insufficient documentation

## 2012-06-26 MED ORDER — VALACYCLOVIR HCL 500 MG PO TABS: ORAL_TABLET | ORAL | Status: DC

## 2012-06-26 NOTE — Progress Notes (Signed)
AEX:  Last Pap: 10/28/2010 WNL: Yes Regular Periods:no Pt c/o painful cycles. Also states that she is having nausea and vomiting with cycles. Also states that cycles are heavier and longer than normal. Contraception: None  Monthly Breast exam:yes Tetanus<60yrs:yes Nl.Bladder Function:yes Daily BMs:yes Healthy Diet:yes Calcium:no Mammogram:no Date of Mammogram: n/a Exercise:no Have often Exercise: n/a Seatbelt: yes Abuse at home: no Stressful work:no Sigmoid-colonoscopy: n/a Bone Density: No PCP: None Change in PMH: None Change in [facility]: None     Subjective:    Judy Baird is a 36 y.o. female, G2P2, who presents for an annual exam.     History   Social History  . Marital Status: Married    Spouse Name: N/A    Number of Children: N/A  . Years of Education: N/A   Social History Main Topics  . Smoking status: Never Smoker   . Smokeless tobacco: Never Used  . Alcohol Use: Yes     allegedly has abd pain near menses, worse if drinks etoH  . Drug Use: No  . Sexually Active: Yes    Birth Control/ Protection: None   Other Topics Concern  . None   Social History Narrative  . None    Menstrual cycle:   LMP: Patient's last menstrual period was 06/03/2012.           Cycle: monthly for 7 days.  No IM bleeding.  1 day prior to menses has headaches, nausea, vomiting and severe pain.has neeeded to have IV fluids for the last several cycles.  Pt's mom has endometriosis The following portions of the patient's history were reviewed and updated as appropriate: allergies, current medications, past family history, past medical history, past social history, past surgical history and problem list. 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  Review of Systems Pertinent items are noted in HPI. Breast:Negative for breast lump,nipple discharge or nipple retraction Gastrointestinal: Negative for abdominal pain, change in bowel habits or rectal bleeding Urinary:negative     Objective:    BP 102/66  Ht 5\' 3"  (1.6 m)  Wt 135 lb (61.236 kg)  BMI 23.91 kg/m2  LMP 06/03/2012    Weight:  Wt Readings from Last 1 Encounters:  06/26/12 135 lb (61.236 kg)          BMI: Body mass index is 23.91 kg/(m^2).  General Appearance: Alert, appropriate appearance for age. No acute distress HEENT: Grossly normal Neck / Thyroid: Supple, no masses, nodes or enlargement Lungs: clear to auscultation bilaterally Back: No CVA tenderness Breast Exam: No masses or nodes.No dimpling, nipple retraction or discharge. Cardiovascular: Regular rate and rhythm. S1, S2, no murmur Gastrointestinal: Soft, non-tender, no masses or organomegaly.  Suprapubic incision is healed with some contracture Pelvic Exam: External genitalia: hidradenitis over mons and labia majora.  2 mm verrucous flat topped lesion on L buttock Vaginal: normal mucosa without prolapse or lesions Cervix: normal appearance Adnexa: non palpable Uterus: normal single, nontender, mid-position and mobile nontender Rectovaginal: normal rectal, no masses Lymphatic Exam: Non-palpable nodes in neck, clavicular, axillary, or inguinal regions  Skin: no rash or abnormalities.  Axillary incisions s/p sweat gland resection Neurologic: Normal gait and speech, no tremor  Psychiatric: Alert and oriented, appropriate affect.   Wet Prep:not applicable Urinalysis:not applicable UPT: Not done   Assessment:    worsening dysmenorrhea, now with nausea and vomiting  Family hx endometriosis Hx poor incisional healing  Plan:    pap smear with HR HPV STD screening: declined Contraception:bilateral  tubal ligation Options for management of dymenorrhea reviewed with literature given.  Pt will consider and is leaning toward Mirena. Will F/U prn decision.      Dierdre Forth, MD

## 2012-06-26 NOTE — Patient Instructions (Addendum)
ACOG pamphlet about Endometriosis                            laparoscopy  Levonorgestrel intrauterine device (IUD) Mirena What is this medicine? LEVONORGESTREL IUD (LEE voe nor jes trel) is a contraceptive (birth control) device. It is used to prevent pregnancy and to treat heavy bleeding that occurs during your period. It can be used for up to 5 years. This medicine may be used for other purposes; ask your health care provider or pharmacist if you have questions. What should I tell my health care provider before I take this medicine? They need to know if you have any of these conditions: -abnormal Pap smear -cancer of the breast, uterus, or cervix -diabetes -endometritis -genital or pelvic infection now or in the past -have more than one sexual partner or your partner has more than one partner -heart disease -history of an ectopic or tubal pregnancy -immune system problems -IUD in place -liver disease or tumor -problems with blood clots or take blood-thinners -use intravenous drugs -uterus of unusual shape -vaginal bleeding that has not been explained -an unusual or allergic reaction to levonorgestrel, other hormones, silicone, or polyethylene, medicines, foods, dyes, or preservatives -pregnant or trying to get pregnant -breast-feeding How should I use this medicine? This device is placed inside the uterus by a health care professional. Talk to your pediatrician regarding the use of this medicine in children. Special care may be needed. Overdosage: If you think you have taken too much of this medicine contact a poison control center or emergency room at once. NOTE: This medicine is only for you. Do not share this medicine with others. What if I miss a dose? This does not apply. What may interact with this medicine? Do not take this medicine with any of the following medications: -amprenavir -bosentan -fosamprenavir This medicine may also interact with the following  medications: -aprepitant -barbiturate medicines for inducing sleep or treating seizures -bexarotene -griseofulvin -medicines to treat seizures like carbamazepine, ethotoin, felbamate, oxcarbazepine, phenytoin, topiramate -modafinil -pioglitazone -rifabutin -rifampin -rifapentine -some medicines to treat HIV infection like atazanavir, indinavir, lopinavir, nelfinavir, tipranavir, ritonavir -St. John's wort -warfarin This list may not describe all possible interactions. Give your health care provider a list of all the medicines, herbs, non-prescription drugs, or dietary supplements you use. Also tell them if you smoke, drink alcohol, or use illegal drugs. Some items may interact with your medicine. What should I watch for while using this medicine? Visit your doctor or health care professional for regular check ups. See your doctor if you or your partner has sexual contact with others, becomes HIV positive, or gets a sexual transmitted disease. This product does not protect you against HIV infection (AIDS) or other sexually transmitted diseases. You can check the placement of the IUD yourself by reaching up to the top of your vagina with clean fingers to feel the threads. Do not pull on the threads. It is a good habit to check placement after each menstrual period. Call your doctor right away if you feel more of the IUD than just the threads or if you cannot feel the threads at all. The IUD may come out by itself. You may become pregnant if the device comes out. If you notice that the IUD has come out use a backup birth control method like condoms and call your health care provider. Using tampons will not change the position of the IUD and are okay  to use during your period. What side effects may I notice from receiving this medicine? Side effects that you should report to your doctor or health care professional as soon as possible: -allergic reactions like skin rash, itching or hives, swelling  of the face, lips, or tongue -fever, flu-like symptoms -genital sores -high blood pressure -no menstrual period for 6 weeks during use -pain, swelling, warmth in the leg -pelvic pain or tenderness -severe or sudden headache -signs of pregnancy -stomach cramping -sudden shortness of breath -trouble with balance, talking, or walking -unusual vaginal bleeding, discharge -yellowing of the eyes or skin Side effects that usually do not require medical attention (report to your doctor or health care professional if they continue or are bothersome): -acne -breast pain -change in sex drive or performance -changes in weight -cramping, dizziness, or faintness while the device is being inserted -headache -irregular menstrual bleeding within first 3 to 6 months of use -nausea This list may not describe all possible side effects. Call your doctor for medical advice about side effects. You may report side effects to FDA at 1-800-FDA-1088. Where should I keep my medicine? This does not apply. NOTE: This sheet is a summary. It may not cover all possible information. If you have questions about this medicine, talk to your doctor, pharmacist, or health care provider.  2012, [employer] Standard. (10/16/2008 6:39:08 PM)

## 2012-06-27 LAB — PAP IG AND HPV HIGH-RISK: HPV DNA High Risk: NOT DETECTED

## 2012-07-01 ENCOUNTER — Telehealth: Payer: Self-pay | Admitting: Obstetrics and Gynecology

## 2012-07-01 MED ORDER — NAPROXEN SODIUM 550 MG PO TABS: ORAL_TABLET | ORAL | Status: DC

## 2012-07-01 NOTE — Telephone Encounter (Signed)
Tc to pt per telephone call. Pt opts to proceed with "laparoscopy" to r/o endometriosis. Will make [facility] aware of decision. Pt voices understanding.

## 2012-07-01 NOTE — Telephone Encounter (Signed)
Pt also request rx for pain meds. LMP-06/30/12. Pt c/o severe cramping without improvement with Ibuprofen or ASA. Consulted with [facility], pt may try Anaprox DS. Rx e-pres to pharm on file. Pt agrees.

## 2012-07-02 NOTE — Telephone Encounter (Signed)
Order sent for scheduling laparoscopy.  Pt to call if no relief from Anaprox and will order Vicodin5/500 one to two tabs every 4 hrs as needed for cramps #30 with no refill.

## 2012-07-05 ENCOUNTER — Other Ambulatory Visit: Payer: Self-pay | Admitting: Obstetrics and Gynecology

## 2012-07-05 ENCOUNTER — Telehealth: Payer: Self-pay | Admitting: Obstetrics and Gynecology

## 2012-07-05 NOTE — Telephone Encounter (Signed)
Open Laparoscopy scheduled for 07/26/12 @ 9:00 with [facility].  [facility] effective 71/13. Plan pays 80/20after a $400 deductible. Pre-op due $93.69. -Adrianne Pridgen

## 2012-07-08 ENCOUNTER — Encounter (HOSPITAL_COMMUNITY): Payer: Self-pay | Admitting: Pharmacist

## 2012-07-18 ENCOUNTER — Encounter (HOSPITAL_COMMUNITY)
Admission: RE | Admit: 2012-07-18 | Discharge: 2012-07-18 | Disposition: A | Discharge status: Home / Self Care | Payer: 59 | Source: Ambulatory Visit | Attending: Obstetrics and Gynecology | Admitting: Obstetrics and Gynecology

## 2012-07-18 ENCOUNTER — Encounter (HOSPITAL_COMMUNITY): Payer: Self-pay

## 2012-07-18 HISTORY — DX: Other specified health status: Z78.9

## 2012-07-18 LAB — CBC
Hemoglobin: 12.8 g/dL (ref 12.0–15.0)
MCH: 28.6 pg (ref 26.0–34.0)
MCHC: 33.8 g/dL (ref 30.0–36.0)

## 2012-07-18 LAB — SURGICAL PCR SCREEN: MRSA, PCR: NEGATIVE

## 2012-07-18 NOTE — Patient Instructions (Addendum)
   Your procedure is scheduled WU:[id number] October 18th  Enter through the Main Entrance of [facility] at:7:30am Pick up the phone at the desk and dial [phone] and inform us of your arrival.  Please call this number if you have any problems the morning of surgery: [phone]  Remember: Do not eat or drink anything after midnight on Thursday  Do not wear jewelry, make-up, or FINGER nail polish No metal in your hair or on your body. Do not wear lotions, powders, perfumes. You may wear deodorant.  Please use your CHG wash as directed prior to surgery.  Do not shave anywhere for at least 12 hours prior to first CHG shower.  Do not bring valuables to the hospital. Contacts may not be worn into surgery.    Patients discharged on the day of surgery will not be allowed to drive home.

## 2012-07-25 NOTE — H&P (Signed)
Judy Baird is an 36 y.o. female who presents for laparoscopy for dysmenorrhea which has been worsening over the last year.  Pertinent Gynecological History: Menses: flow is moderate, with severe dysmenorrhea and associated with nausea and vomiting  LMP:07-23-12 through today.  Cramping started 07-22-12.  Pt started Naproxen on 07-21-12 so no nausea or vomiting this cycle. Contraception: tubal ligation Blood transfusions: none Sexually transmitted diseases: past history: HSV II Last pap: Normal with neg HR HPV Date: 06/16/12 OB History: G2, P2   Menstrual History: Menarche age: 56 No LMP recorded.  07/23/12    Past Medical History  Diagnosis Date  . Enlarged uterus   . Seroma   . Dysmenorrhea   . BV (bacterial vaginosis)   . HSV-2 infection   . Chlamydia   . Cellulitis 2011    c/s scar revision  . No pertinent past medical history     Past Surgical History  Procedure Date  . Cesarean section   . Cesarean section w/btl 2001,2010    Dr Pennie Rushing  . Revision of abdominal scar 2011    Dr Pennie Rushing at [facility], for cyclic abd pain    Family History  Problem Relation Age of Onset  . Diabetes Maternal Grandfather   . Diabetes Maternal Aunt   . Cancer Maternal Grandfather   . Hypertension Father   . Hypertension Paternal Grandmother   . Hypertension Maternal Grandmother     Social History:  reports that she has been smoking Cigarettes.  She has smoked for the past 2 years. She has never used smokeless tobacco. She reports that she drinks alcohol. She reports that she does not use illicit drugs.  Allergies: No Known Allergies  Prescriptions prior to admission  Medication Sig Dispense Refill  . ibuprofen (ADVIL,MOTRIN) 200 MG tablet Take 400 mg by mouth every 6 (six) hours as needed. For menstrual pain      . Multiple Vitamin (MULTIVITAMIN WITH MINERALS) TABS Take 1 tablet by mouth daily.      . naproxen sodium (ANAPROX) 550 MG tablet Take 550 mg by mouth 2 (two) times daily with  a meal.        Review of Systems  Constitutional: Negative.   HENT: Negative.   Eyes: Negative.   Respiratory: Negative.   Cardiovascular: Negative.   Gastrointestinal: Positive for nausea, vomiting and abdominal pain.       All symptoms associated with menses only  Genitourinary: Negative.   Musculoskeletal: Negative.   Skin: Negative.   Neurological: Negative.   Endo/Heme/Allergies: Negative.   Psychiatric/Behavioral: Negative.     Blood pressure 114/84, pulse 93, temperature 98.4 F (36.9 C), temperature source Oral, resp. rate 18, SpO2 97.00%. Physical Exam  Constitutional: She is oriented to person, place, and time. She appears well-developed and well-nourished.  HENT:  Head: Normocephalic and atraumatic.  Eyes: EOM are normal.  Neck: Normal range of motion. Neck supple. Thyromegaly present.  Cardiovascular: Normal rate and regular rhythm.   Respiratory: Effort normal and breath sounds normal.  GI: Soft. Bowel sounds are normal. There is no tenderness. There is no rebound and no guarding.  Musculoskeletal: Normal range of motion.  Neurological: She is alert and oriented to person, place, and time.  Skin: Skin is warm and dry.  Pelvic Exam: External genitalia: hidradenitis over mons and labia majora. 2 mm verrucous flat topped lesion on L buttock  Vaginal: normal mucosa without prolapse or lesions  Cervix: normal appearance  Adnexa: non palpable  Uterus: normal single, nontender, mid-position  and mobile nontender  Rectovaginal: normal rectal, no masses   Results for orders placed during the hospital encounter of 07/26/12 (from the past 24 hour(s))  PREGNANCY, URINE     Status: Normal   Collection Time   07/26/12  8:09 AM      Component Value Range   Preg Test, Ur NEGATIVE  NEGATIVE    hgb 12.8  Assessment/ Worsening dysmenorrhea, now with nausea and vomiting  Family hx endometriosis  Hx poor incisional healing    Plan After considering her options for  management, the pt has decided to undergo Laparoscopy for furthter evaluation of worsening dysmenorrhea.  Risks and benefits have been reviewed and she wishes to proceed.  Braxson Hollingsworth P 07/26/2012, 9:23 AM

## 2012-07-26 ENCOUNTER — Encounter (HOSPITAL_COMMUNITY): Payer: Self-pay | Admitting: Anesthesiology

## 2012-07-26 ENCOUNTER — Ambulatory Visit (HOSPITAL_COMMUNITY): Payer: 59 | Admitting: Anesthesiology

## 2012-07-26 ENCOUNTER — Ambulatory Visit (HOSPITAL_COMMUNITY)
Admission: RE | Admit: 2012-07-26 | Discharge: 2012-07-26 | Disposition: A | Discharge status: Home / Self Care | Payer: 59 | Source: Ambulatory Visit | Attending: Obstetrics and Gynecology | Admitting: Obstetrics and Gynecology

## 2012-07-26 ENCOUNTER — Encounter (HOSPITAL_COMMUNITY)
Admission: RE | Disposition: A | Discharge status: Home / Self Care | Payer: Self-pay | Source: Ambulatory Visit | Attending: Obstetrics and Gynecology

## 2012-07-26 DIAGNOSIS — N946 Dysmenorrhea, unspecified: Secondary | ICD-10-CM

## 2012-07-26 DIAGNOSIS — L918 Other hypertrophic disorders of the skin: Secondary | ICD-10-CM | POA: Insufficient documentation

## 2012-07-26 HISTORY — PX: LAPAROSCOPY: SHX197

## 2012-07-26 SURGERY — LAPAROSCOPY OPERATIVE
Anesthesia: General | Site: Abdomen | Wound class: Clean Contaminated

## 2012-07-26 MED ORDER — MEPERIDINE HCL 25 MG/ML IJ SOLN: 6.2500 mg | INTRAMUSCULAR | Status: DC | PRN

## 2012-07-26 MED ORDER — HYDROCODONE-ACETAMINOPHEN 5-325 MG PO TABS
ORAL_TABLET | ORAL | Status: AC
  Administered 2012-07-26: 1 via ORAL
  Filled 2012-07-26: qty 1

## 2012-07-26 MED ORDER — ROCURONIUM BROMIDE 50 MG/5ML IV SOLN
INTRAVENOUS | Status: AC
  Filled 2012-07-26: qty 1

## 2012-07-26 MED ORDER — FENTANYL CITRATE 0.05 MG/ML IJ SOLN
INTRAMUSCULAR | Status: AC
  Filled 2012-07-26: qty 5

## 2012-07-26 MED ORDER — NEOSTIGMINE METHYLSULFATE 1 MG/ML IJ SOLN
INTRAMUSCULAR | Status: AC
  Filled 2012-07-26: qty 10

## 2012-07-26 MED ORDER — NAPROXEN SODIUM 550 MG PO TABS: ORAL_TABLET | ORAL | Status: DC

## 2012-07-26 MED ORDER — ONDANSETRON HCL 4 MG/2ML IJ SOLN
INTRAMUSCULAR | Status: AC
  Filled 2012-07-26: qty 2

## 2012-07-26 MED ORDER — BUPIVACAINE HCL (PF) 0.25 % IJ SOLN
INTRAMUSCULAR | Status: DC | PRN
  Administered 2012-07-26: 9 mL

## 2012-07-26 MED ORDER — DEXAMETHASONE SODIUM PHOSPHATE 10 MG/ML IJ SOLN
INTRAMUSCULAR | Status: DC | PRN
  Administered 2012-07-26: 10 mg via INTRAVENOUS

## 2012-07-26 MED ORDER — ROCURONIUM BROMIDE 100 MG/10ML IV SOLN
INTRAVENOUS | Status: DC | PRN
  Administered 2012-07-26: 15 mg via INTRAVENOUS
  Administered 2012-07-26: 35 mg via INTRAVENOUS

## 2012-07-26 MED ORDER — LIDOCAINE HCL (CARDIAC) 20 MG/ML IV SOLN
INTRAVENOUS | Status: AC
  Filled 2012-07-26: qty 5

## 2012-07-26 MED ORDER — HYDROCODONE-ACETAMINOPHEN 5-500 MG PO TABS: 1.0000 | ORAL_TABLET | Freq: Four times a day (QID) | ORAL | Status: DC | PRN

## 2012-07-26 MED ORDER — GLYCOPYRROLATE 0.2 MG/ML IJ SOLN
INTRAMUSCULAR | Status: AC
  Filled 2012-07-26: qty 1

## 2012-07-26 MED ORDER — DEXAMETHASONE SODIUM PHOSPHATE 10 MG/ML IJ SOLN
INTRAMUSCULAR | Status: AC
  Filled 2012-07-26: qty 1

## 2012-07-26 MED ORDER — GLYCOPYRROLATE 0.2 MG/ML IJ SOLN
INTRAMUSCULAR | Status: DC | PRN
  Administered 2012-07-26: .8 mg via INTRAVENOUS
  Administered 2012-07-26: 0.1 mg via INTRAVENOUS

## 2012-07-26 MED ORDER — NEOSTIGMINE METHYLSULFATE 1 MG/ML IJ SOLN
INTRAMUSCULAR | Status: DC | PRN
  Administered 2012-07-26: 4 mg via INTRAVENOUS

## 2012-07-26 MED ORDER — HYDROCODONE-ACETAMINOPHEN 5-325 MG PO TABS
1.0000 | ORAL_TABLET | Freq: Once | ORAL | Status: AC
  Administered 2012-07-26: 1 via ORAL

## 2012-07-26 MED ORDER — ONDANSETRON HCL 4 MG/2ML IJ SOLN
INTRAMUSCULAR | Status: DC | PRN
  Administered 2012-07-26: 4 mg via INTRAVENOUS

## 2012-07-26 MED ORDER — MIDAZOLAM HCL 5 MG/5ML IJ SOLN
INTRAMUSCULAR | Status: DC | PRN
  Administered 2012-07-26: 2 mg via INTRAVENOUS

## 2012-07-26 MED ORDER — PROPOFOL 10 MG/ML IV BOLUS
INTRAVENOUS | Status: DC | PRN
  Administered 2012-07-26: 170 mg via INTRAVENOUS
  Administered 2012-07-26: 30 mg via INTRAVENOUS

## 2012-07-26 MED ORDER — LACTATED RINGERS IV SOLN
INTRAVENOUS | Status: DC
  Administered 2012-07-26 (×2): via INTRAVENOUS

## 2012-07-26 MED ORDER — FENTANYL CITRATE 0.05 MG/ML IJ SOLN: 25.0000 ug | INTRAMUSCULAR | Status: DC | PRN

## 2012-07-26 MED ORDER — FENTANYL CITRATE 0.05 MG/ML IJ SOLN
INTRAMUSCULAR | Status: DC | PRN
  Administered 2012-07-26: 50 ug via INTRAVENOUS
  Administered 2012-07-26 (×2): 100 ug via INTRAVENOUS

## 2012-07-26 MED ORDER — KETOROLAC TROMETHAMINE 60 MG/2ML IM SOLN
INTRAMUSCULAR | Status: DC | PRN
  Administered 2012-07-26: 30 mg via INTRAMUSCULAR

## 2012-07-26 MED ORDER — BUPIVACAINE HCL (PF) 0.25 % IJ SOLN
INTRAMUSCULAR | Status: AC
  Filled 2012-07-26: qty 30

## 2012-07-26 MED ORDER — METOCLOPRAMIDE HCL 5 MG/ML IJ SOLN: 10.0000 mg | Freq: Once | INTRAMUSCULAR | Status: DC | PRN

## 2012-07-26 MED ORDER — METOCLOPRAMIDE HCL 5 MG/ML IJ SOLN
INTRAMUSCULAR | Status: AC
  Administered 2012-07-26: 10 mg via INTRAVENOUS
  Filled 2012-07-26: qty 2

## 2012-07-26 MED ORDER — PROPOFOL 10 MG/ML IV EMUL
INTRAVENOUS | Status: AC
  Filled 2012-07-26: qty 20

## 2012-07-26 MED ORDER — LIDOCAINE HCL (CARDIAC) 20 MG/ML IV SOLN
INTRAVENOUS | Status: DC | PRN
  Administered 2012-07-26: 80 mg via INTRAVENOUS

## 2012-07-26 MED ORDER — KETOROLAC TROMETHAMINE 60 MG/2ML IM SOLN
INTRAMUSCULAR | Status: AC
  Filled 2012-07-26: qty 2

## 2012-07-26 MED ORDER — KETOROLAC TROMETHAMINE 30 MG/ML IJ SOLN
INTRAMUSCULAR | Status: DC | PRN
  Administered 2012-07-26: 30 mg via INTRAVENOUS

## 2012-07-26 MED ORDER — MIDAZOLAM HCL 2 MG/2ML IJ SOLN
INTRAMUSCULAR | Status: AC
  Filled 2012-07-26: qty 2

## 2012-07-26 MED ORDER — GLYCOPYRROLATE 0.2 MG/ML IJ SOLN
INTRAMUSCULAR | Status: AC
  Filled 2012-07-26: qty 3

## 2012-07-26 MED ORDER — METOCLOPRAMIDE HCL 5 MG/ML IJ SOLN
10.0000 mg | Freq: Once | INTRAMUSCULAR | Status: AC
  Administered 2012-07-26: 10 mg via INTRAVENOUS

## 2012-07-26 SURGICAL SUPPLY — 28 items
CABLE HIGH FREQUENCY MONO STRZ (ELECTRODE) IMPLANT
CATH ROBINSON RED A/P 16FR (CATHETERS) IMPLANT
CHLORAPREP W/TINT 26ML (MISCELLANEOUS) ×2 IMPLANT
CLOTH BEACON ORANGE TIMEOUT ST (SAFETY) ×2 IMPLANT
CONTAINER PREFILL 10% NBF 60ML (FORM) IMPLANT
DERMABOND ADVANCED (GAUZE/BANDAGES/DRESSINGS)
DERMABOND ADVANCED .7 DNX12 (GAUZE/BANDAGES/DRESSINGS) IMPLANT
DRESSING TELFA 8X3 (GAUZE/BANDAGES/DRESSINGS) IMPLANT
DRSG COVADERM PLUS 2X2 (GAUZE/BANDAGES/DRESSINGS) ×2 IMPLANT
GLOVE SURG SS PI 6.5 STRL IVOR (GLOVE) ×4 IMPLANT
GOWN PREVENTION PLUS LG XLONG (DISPOSABLE) ×6 IMPLANT
NS IRRIG 1000ML POUR BTL (IV SOLUTION) ×2 IMPLANT
PACK LAPAROSCOPY BASIN (CUSTOM PROCEDURE TRAY) ×2 IMPLANT
POUCH SPECIMEN RETRIEVAL 10MM (ENDOMECHANICALS) IMPLANT
PROTECTOR NERVE ULNAR (MISCELLANEOUS) ×4 IMPLANT
SCALPEL HARMONIC ACE (MISCELLANEOUS) IMPLANT
SET IRRIG TUBING LAPAROSCOPIC (IRRIGATION / IRRIGATOR) ×2 IMPLANT
STRIP CLOSURE SKIN 1/4X3 (GAUZE/BANDAGES/DRESSINGS) ×2 IMPLANT
SUT VIC AB 3-0 PS2 18 (SUTURE)
SUT VIC AB 3-0 PS2 18XBRD (SUTURE) IMPLANT
SUT VICRYL 0 ENDOLOOP (SUTURE) IMPLANT
SUT VICRYL 0 UR6 27IN ABS (SUTURE) ×4 IMPLANT
TOWEL OR 17X24 6PK STRL BLUE (TOWEL DISPOSABLE) ×4 IMPLANT
TRAY FOLEY CATH 14FR (SET/KITS/TRAYS/PACK) ×2 IMPLANT
TROCAR BALL TOP DISP 5MM (ENDOMECHANICALS) IMPLANT
TROCAR Z-THREAD BLADED 11X100M (TROCAR) IMPLANT
WARMER LAPAROSCOPE (MISCELLANEOUS) ×2 IMPLANT
WATER STERILE IRR 1000ML POUR (IV SOLUTION) IMPLANT

## 2012-07-26 NOTE — Addendum Note (Signed)
Addendum  created 07/26/12 1203 by Tyrone Apple. Malen Gauze, MD   Modules edited:Orders, PRL Based Order Sets

## 2012-07-26 NOTE — Anesthesia Postprocedure Evaluation (Signed)
  Anesthesia Post-op Note  Patient: Judy Baird  Procedure(s) Performed: Procedure(s) (LRB) with comments: LAPAROSCOPY OPERATIVE (N/A) - Open Laparoscopy 90 minutes  Patient Location: PACU  Anesthesia Type: General  Level of Consciousness: awake, alert  and oriented  Airway and Oxygen Therapy: Patient Spontanous Breathing  Post-op Pain: none  Post-op Assessment: Post-op Vital signs reviewed, Patient's Cardiovascular Status Stable, Respiratory Function Stable, Patent Airway, No signs of Nausea or vomiting and Pain level controlled  Post-op Vital Signs: Reviewed and stable  Complications: No apparent anesthesia complications

## 2012-07-26 NOTE — Anesthesia Preprocedure Evaluation (Signed)
Anesthesia Evaluation  Patient identified by MRN, date of birth, ID band Patient awake    Reviewed: Allergy & Precautions, H&P , NPO status , Patient's Chart, lab work & pertinent test results  Airway Mallampati: III TM Distance: >3 FB Neck ROM: full    Dental No notable dental hx. (+) Teeth Intact   Pulmonary Current Smoker,  breath sounds clear to auscultation  Pulmonary exam normal       Cardiovascular negative cardio ROS  Rhythm:regular Rate:Normal     Neuro/Psych negative neurological ROS  negative psych ROS   GI/Hepatic Neg liver ROS,   Endo/Other  negative endocrine ROS  Renal/GU negative Renal ROS  negative genitourinary   Musculoskeletal   Abdominal Normal abdominal exam  (+)   Peds  Hematology negative hematology ROS (+)   Anesthesia Other Findings   Reproductive/Obstetrics negative OB ROS Dysmenorrhea                           Anesthesia Physical Anesthesia Plan  ASA: II  Anesthesia Plan:    Post-op Pain Management:    Induction:   Airway Management Planned:   Additional Equipment:   Intra-op Plan:   Post-operative Plan:   Informed Consent: I have reviewed the patients History and Physical, chart, labs and discussed the procedure including the risks, benefits and alternatives for the proposed anesthesia with the patient or authorized representative who has indicated his/her understanding and acceptance.   Dental Advisory Given  Plan Discussed with: Anesthesiologist, CRNA and Surgeon  Anesthesia Plan Comments:         Anesthesia Quick Evaluation

## 2012-07-26 NOTE — Transfer of Care (Signed)
Immediate Anesthesia Transfer of Care Note  Patient: Judy Baird  Procedure(s) Performed: Procedure(s) (LRB) with comments: LAPAROSCOPY OPERATIVE (N/A) - Open Laparoscopy 90 minutes  Patient Location: PACU  Anesthesia Type: General  Level of Consciousness: awake, alert  and oriented  Airway & Oxygen Therapy: Patient Spontanous Breathing and Patient connected to nasal cannula oxygen  Post-op Assessment: Report given to PACU RN and Post -op Vital signs reviewed and stable  Post vital signs: Reviewed and stable  Complications: No apparent anesthesia complications

## 2012-07-26 NOTE — Op Note (Signed)
Diagnostic Laparoscopy Procedure Note  Indications: The patient is a 36 y.o. female with increasing dysmenorrhea  Pre-operative Diagnosis: Dysmenorrhea. Family history of endometriosis  Post-operative Diagnosis: Same      Granulation tissue at the site of previous suprapubic incision  Surgeon: Dierdre Forth P   Assistants: None  Anesthesia: General endotracheal anesthesia  ASA Class: 1  Procedure Details  The patient was seen in the Holding Room. The risks, benefits, complications, treatment options, and expected outcomes were discussed with the patient. The possibilities of reaction to medication, pulmonary aspiration, perforation of viscus, bleeding, recurrent infection, the need for additional procedures, failure to diagnose a condition, and creating a complication requiring transfusion or operation were discussed with the patient. The patient concurred with the proposed plan, giving informed consent. The patient was taken to the Operating Room, identified as Judy Baird and the procedure verified as Diagnostic Laparoscopy. A Time Out was held and the above information confirmed.  After induction of general anesthesia, the patient was placed in modified dorsal lithotomy position where she was prepped, draped, and catheterized in the normal, sterile fashion.  The cervix was visualized and an intrauterine manipulator was placed. The subumbilical area was infiltrated with 6 cc of 0.25% Marcaine. A 2 cm umbilical incision was then performed in the skin. Blunt dissection allowed the fascia to be grasped with 2 Coker clamps and elevated peer the fascia was incised. And grasped with Coker clamps. The peritoneum was bluntly entered. A suture was used to mark the fascia and peritoneum on each side and held. The Hassan cannula was placed into the peritoneal cavity under direct visualization and the fascia and peritoneum secured to the sides of the Cannula by Wrapping the Suture around Either Side.   Pneumoperitoneum was established.  The laparoscope was placed through the [facility] trocar sleeve and the below findings noted.  Following the procedure the umbilical sheath was removed and the intra-abdominal carbon dioxide was expressed. The incision was closed with the 0 Vicryl sutures which had been used to mark and hold the fascia placed into a figure of 8 fashion. The skin was closed with a subcuticular suture of 3-0 Monocryl. The intrauterine manipulator and Foley catheter were  then removed. Areas of granulation tissue near the previous suprapubic incision were touched with silver nitrate and infiltrated in the septum cutaneous area with a total of 3 cc of 0.25% Marcaine. The patient was medicated with Toradol 30 mg IV and 30 mg IM prior to emergence from anesthesia.  Instrument, sponge, and needle counts were correct prior to abdominal closure and at the conclusion of the case.   Findings: The anterior cul-de-sac and round ligaments no significant adhesions and no lesions The uterus upper limits of normal size without lesions. Freely mobile. The adnexa : Normal ovaries bilaterally no evidence of endometriosis. Both ovaries moved easily without any adhesions. The tubes were status post interruption for surgical sterilization. Cul-de-sac: no adhesions or lesions or peritoneal defects that would be consistent with endometriosis.  Estimated Blood Loss:  Minimal         Drains: None         Total IV Fluids: 1000 mL         Specimens: None              Complications:  None; patient tolerated the procedure well.         Disposition: PACU - hemodynamically stable.         Condition: stable.  Discharge home anticipated  after recovery from anesthesia

## 2012-07-26 NOTE — Anesthesia Procedure Notes (Signed)
Procedure Name: Intubation Date/Time: 07/26/2012 9:53 AM Performed by: Graciela Husbands Pre-anesthesia Checklist: Suction available, Timeout performed, Emergency Drugs available, Patient identified and Patient being monitored Patient Re-evaluated:Patient Re-evaluated prior to inductionOxygen Delivery Method: Circle system utilized Preoxygenation: Pre-oxygenation with 100% oxygen Intubation Type: IV induction Ventilation: Mask ventilation without difficulty Laryngoscope Size: Mac and 4 Grade View: Grade II Tube type: Oral Tube size: 7.0 mm Number of attempts: 1 Airway Equipment and Method: Stylet Placement Confirmation: ETT inserted through vocal cords under direct vision,  breath sounds checked- equal and bilateral and positive ETCO2 (Cricoid pressure for view of cords.) Secured at: 21 cm Tube secured with: Tape Dental Injury: Teeth and Oropharynx as per pre-operative assessment

## 2012-07-29 ENCOUNTER — Encounter (HOSPITAL_COMMUNITY): Payer: Self-pay | Admitting: Obstetrics and Gynecology

## 2012-07-31 ENCOUNTER — Encounter (HOSPITAL_COMMUNITY): Payer: Self-pay

## 2012-08-12 ENCOUNTER — Ambulatory Visit (INDEPENDENT_AMBULATORY_CARE_PROVIDER_SITE_OTHER): Payer: 59 | Admitting: Obstetrics and Gynecology

## 2012-08-12 ENCOUNTER — Encounter: Payer: Self-pay | Admitting: Obstetrics and Gynecology

## 2012-08-12 VITALS — BP 102/62 | HR 74 | Temp 98.4°F | Resp 16 | Ht 63.0 in | Wt 134.0 lb

## 2012-08-12 DIAGNOSIS — N949 Unspecified condition associated with female genital organs and menstrual cycle: Secondary | ICD-10-CM

## 2012-08-12 DIAGNOSIS — N946 Dysmenorrhea, unspecified: Secondary | ICD-10-CM

## 2012-08-12 DIAGNOSIS — R102 Pelvic and perineal pain: Secondary | ICD-10-CM

## 2012-08-12 MED ORDER — KETOROLAC TROMETHAMINE 10 MG PO TABS: 10.0000 mg | ORAL_TABLET | Freq: Four times a day (QID) | ORAL | Status: DC | PRN

## 2012-08-12 MED ORDER — MEDROXYPROGESTERONE ACETATE 10 MG PO TABS: ORAL_TABLET | ORAL | Status: DC

## 2012-08-12 NOTE — Progress Notes (Signed)
GYN PROBLEM VISIT  DATE OF SURGERY: 07/26/2012 TYPE OF SURGERY: Diagnostic Laparoscopy PAIN:Yes slight VAG BLEEDING: no VAG DISCHARGE: no NORMAL GI FUNCTN: yes NORMAL GU FUNCTN: yes   Subjective:     Judy Baird is a 36 y.o. female who presents for post-op visit. She had no difficulty after the laparoscopy, but is about 2 wks from her cycle and has already started to have crampy abdominal pain.  Pain unrelieved by naprosyn 550, required 2 tabs at once.  Feels she will have more severe cramping with menses Also c/o anger and moodiness 2 wks prior to menses.     The following portions of the patient's history were reviewed and updated as appropriate: allergies, current medications, past family history, past medical history, past social history, past surgical history and problem list.  Review of Systems Pertinent items are noted in HPI.   Objective:    BP 102/62  Pulse 74  Temp 98.4 F (36.9 C) (Oral)  Resp 16  Ht 5\' 3"  (1.6 m)  Wt 134 lb (60.782 kg)  BMI 23.74 kg/m2  LMP 07/23/2012 Weight:  Wt Readings from Last 1 Encounters:  08/12/12 134 lb (60.782 kg)    BMI: Body mass index is 23.74 kg/(m^2).  Findings at surgery: The anterior cul-de-sac and round ligaments no significant adhesions and no lesions  The uterus upper limits of normal size without lesions. Freely mobile.  The adnexa : Normal ovaries bilaterally no evidence of endometriosis. Both ovaries moved easily without any adhesions. The tubes were status post interruption for surgical sterilization.  Cul-de-sac: no adhesions or lesions or peritoneal defects that would be consistent with endometriosis.   General Appearance: Alert, appropriate appearance for age. No acute distress Lungs: clear to auscultation bilaterally Back: No CVA tenderness Cardiovascular: Regular rate and rhythm. S1, S2, no murmur Gastrointestinal: Soft, non-tender, no masses or organomegaly Incision/s: healing well Pelvic Exam:  deferred  Assessment:    Doing well postoperatively, but no operative explaination for pain Operative findings again reviewed.Probable adenomyosis   Plan:  We had a prolonged discussion about these complex clinical issues and went over the various important aspects to consider. All questions were answered. She understands that definitive diagnosis of adenomyosis is impossible without hysterectomy so that this is a presumptive diagnosis.  Options for management other than hysterectomy include progesterone therapy, either injectable, oral or by device (Mirena).  She opts for oral . Provera 40 mg daily.  R&B reviewed F/u 4 weeks  Dierdre Forth MD 11/4/201310:40 AM

## 2012-09-10 ENCOUNTER — Ambulatory Visit (INDEPENDENT_AMBULATORY_CARE_PROVIDER_SITE_OTHER): Payer: 59 | Admitting: Obstetrics and Gynecology

## 2012-09-10 ENCOUNTER — Encounter: Payer: Self-pay | Admitting: Obstetrics and Gynecology

## 2012-09-10 VITALS — BP 96/60 | Resp 14 | Ht 63.0 in | Wt 140.0 lb

## 2012-09-10 DIAGNOSIS — N946 Dysmenorrhea, unspecified: Secondary | ICD-10-CM

## 2012-09-10 DIAGNOSIS — N852 Hypertrophy of uterus: Secondary | ICD-10-CM

## 2012-09-10 NOTE — Progress Notes (Signed)
Patient ID: Judy Baird, female   DOB: 1976-04-17, 36 y.o.   MRN: [id number] GYN PROBLEM VISIT  Ms. Judy Baird is a 36 y.o. year old female,G2P2, who presents for a problem visit.   Subjective: Pt is here today to follow up meds. She started Provera 40 mg daily on 08/13/12.  About 2 wks later she had menses.  Her last period was 08/25/2012-09/04/2012 slightly crampy and longer than normal.  She had cramps relieved by Toradol  Objective:  BP 96/60  Resp 14  Ht 5\' 3"  (1.6 m)  Wt 140 lb (63.504 kg)  BMI 24.80 kg/m2  LMP 08/25/2012    Assessment: Dysmenorrhea with probable adenomyosis  Plan: Continue Provera 40 mg qd Return to office in 6 week(s).   Dierdre Forth, MD  09/10/2012 8:03 PM

## 2012-10-23 ENCOUNTER — Encounter: Payer: 59 | Admitting: Obstetrics and Gynecology

## 2012-12-01 ENCOUNTER — Encounter (HOSPITAL_COMMUNITY): Payer: Self-pay | Admitting: *Deleted

## 2012-12-01 ENCOUNTER — Emergency Department (HOSPITAL_COMMUNITY): Payer: 59

## 2012-12-01 ENCOUNTER — Emergency Department (HOSPITAL_COMMUNITY)
Admission: EM | Admit: 2012-12-01 | Discharge: 2012-12-01 | Disposition: A | Discharge status: Home / Self Care | Payer: 59 | Attending: Emergency Medicine | Admitting: Emergency Medicine

## 2012-12-01 DIAGNOSIS — Z8619 Personal history of other infectious and parasitic diseases: Secondary | ICD-10-CM | POA: Insufficient documentation

## 2012-12-01 DIAGNOSIS — F17200 Nicotine dependence, unspecified, uncomplicated: Secondary | ICD-10-CM | POA: Insufficient documentation

## 2012-12-01 DIAGNOSIS — Z8742 Personal history of other diseases of the female genital tract: Secondary | ICD-10-CM | POA: Insufficient documentation

## 2012-12-01 DIAGNOSIS — R1013 Epigastric pain: Secondary | ICD-10-CM | POA: Insufficient documentation

## 2012-12-01 DIAGNOSIS — R197 Diarrhea, unspecified: Secondary | ICD-10-CM | POA: Insufficient documentation

## 2012-12-01 DIAGNOSIS — Z9889 Other specified postprocedural states: Secondary | ICD-10-CM | POA: Insufficient documentation

## 2012-12-01 DIAGNOSIS — A088 Other specified intestinal infections: Secondary | ICD-10-CM | POA: Insufficient documentation

## 2012-12-01 DIAGNOSIS — Z79899 Other long term (current) drug therapy: Secondary | ICD-10-CM | POA: Insufficient documentation

## 2012-12-01 DIAGNOSIS — Z3202 Encounter for pregnancy test, result negative: Secondary | ICD-10-CM | POA: Insufficient documentation

## 2012-12-01 DIAGNOSIS — Z872 Personal history of diseases of the skin and subcutaneous tissue: Secondary | ICD-10-CM | POA: Insufficient documentation

## 2012-12-01 LAB — COMPREHENSIVE METABOLIC PANEL
ALT: 17 U/L (ref 0–35)
Albumin: 4.5 g/dL (ref 3.5–5.2)
Alkaline Phosphatase: 92 U/L (ref 39–117)
Calcium: 10.2 mg/dL (ref 8.4–10.5)
GFR calc Af Amer: >90 mL/min (ref >90)
Glucose, Bld: 132 mg/dL — ABNORMAL HIGH (ref 70–99)
Potassium: 3.9 mEq/L (ref 3.5–5.1)
Sodium: 139 mEq/L (ref 135–145)
Total Protein: 9.2 g/dL — ABNORMAL HIGH (ref 6.0–8.3)

## 2012-12-01 LAB — URINALYSIS, ROUTINE W REFLEX MICROSCOPIC
Bilirubin Urine: NEGATIVE
Hgb urine dipstick: NEGATIVE
Nitrite: NEGATIVE
Specific Gravity, Urine: 1.029 (ref 1.005–1.030)
Urobilinogen, UA: 0.2 mg/dL (ref 0.0–1.0)
pH: 8 (ref 5.0–8.0)

## 2012-12-01 LAB — CBC WITH DIFFERENTIAL/PLATELET
Basophils Absolute: 0 10*3/uL (ref 0.0–0.1)
Basophils Relative: 0 % (ref 0–1)
Eosinophils Absolute: 0 10*3/uL (ref 0.0–0.7)
Eosinophils Relative: 0 % (ref 0–5)
MCH: 29.9 pg (ref 26.0–34.0)
MCHC: 36.3 g/dL — ABNORMAL HIGH (ref 30.0–36.0)
MCV: 82.4 fL (ref 78.0–100.0)
Neutrophils Relative %: 64 % (ref 43–77)
Platelets: 352 10*3/uL (ref 150–400)
RBC: 4.88 MIL/uL (ref 3.87–5.11)
RDW: 12.3 % (ref 11.5–15.5)

## 2012-12-01 LAB — URINE MICROSCOPIC-ADD ON

## 2012-12-01 LAB — POCT PREGNANCY, URINE: Preg Test, Ur: NEGATIVE

## 2012-12-01 LAB — ETHANOL: Alcohol, Ethyl (B): <11 mg/dL (ref 0–11)

## 2012-12-01 MED ORDER — PROMETHAZINE HCL 25 MG/ML IJ SOLN
25.0000 mg | Freq: Once | INTRAMUSCULAR | Status: AC
  Administered 2012-12-01: 25 mg via INTRAVENOUS
  Filled 2012-12-01: qty 1

## 2012-12-01 MED ORDER — HYDROMORPHONE HCL PF 1 MG/ML IJ SOLN
1.0000 mg | Freq: Once | INTRAMUSCULAR | Status: AC
  Administered 2012-12-01: 1 mg via INTRAVENOUS
  Filled 2012-12-01: qty 1

## 2012-12-01 MED ORDER — SODIUM CHLORIDE 0.9 % IV SOLN
INTRAVENOUS | Status: DC
  Administered 2012-12-01: 16:00:00 via INTRAVENOUS

## 2012-12-01 MED ORDER — ONDANSETRON HCL 4 MG/2ML IJ SOLN
4.0000 mg | Freq: Once | INTRAMUSCULAR | Status: AC
  Administered 2012-12-01: 4 mg via INTRAVENOUS
  Filled 2012-12-01: qty 2

## 2012-12-01 MED ORDER — PROMETHAZINE HCL 25 MG PO TABS: 25.0000 mg | ORAL_TABLET | Freq: Four times a day (QID) | ORAL | Status: DC | PRN

## 2012-12-01 MED ORDER — PANTOPRAZOLE SODIUM 40 MG IV SOLR
40.0000 mg | Freq: Once | INTRAVENOUS | Status: AC
  Administered 2012-12-01: 40 mg via INTRAVENOUS
  Filled 2012-12-01: qty 40

## 2012-12-01 NOTE — ED Notes (Signed)
Reports abd pain and cramping, n/v/d today. +etoh last night.

## 2012-12-01 NOTE — ED Provider Notes (Signed)
History     CSN: [id number]  Arrival date & time 12/01/12  1526   First MD Initiated Contact with Patient 12/01/12 1557      Chief Complaint  Patient presents with  . Emesis  . Abdominal Pain    (Consider location/radiation/quality/duration/timing/severity/associated sxs/prior treatment) HPI Comments: Patient says she woke up this morning with abdominal pain, nausea vomiting and diarrhea. She feels the pain in the epigastric region and radiates to the back. She's had prior similar episodes which she was advised caused by viruses infections. She admits to drinking alcohol last night.  Patient is a 37 y.o. female presenting with vomiting and abdominal pain. The history is provided by the patient and medical records. No language interpreter was used.  Emesis Severity:  Severe Duration:  8 hours Timing:  Constant Quality:  Stomach contents Feeding tolerance: Patient cannot tolerate liquids or solids. Progression:  Unchanged Chronicity:  New Recent urination:  Normal Relieved by:  Nothing (She tried to take antinausea medicine but vomited it up.) Associated symptoms: abdominal pain and diarrhea   Associated symptoms: no chills   Abdominal pain:    Location:  Epigastric   Quality:  Sharp   Severity:  Severe   Onset quality:  Sudden   Duration:  8 hours   Timing:  Constant   Progression:  Unchanged   Chronicity:  New Risk factors: alcohol use   Abdominal Pain Associated symptoms: diarrhea, nausea and vomiting   Associated symptoms: no chills and no fever     Past Medical History  Diagnosis Date  . Enlarged uterus   . Seroma   . Dysmenorrhea   . BV (bacterial vaginosis)   . HSV-2 infection   . Chlamydia   . Cellulitis 2011    c/s scar revision  . No pertinent past medical history     Past Surgical History  Procedure Laterality Date  . Cesarean section    . Cesarean section w/btl  2001,2010    Dr Pennie Rushing  . Revision of abdominal scar  2011    Dr Pennie Rushing at  [facility], for cyclic abd pain  . Laparoscopy  07/26/2012    Procedure: LAPAROSCOPY OPERATIVE;  Surgeon: Hal Morales, MD;  Location: [facility];  Service: Gynecology;  Laterality: N/A;  Open Laparoscopy 90 minutes    Family History  Problem Relation Age of Onset  . Diabetes Maternal Grandfather   . Diabetes Maternal Aunt   . Cancer Maternal Grandfather   . Hypertension Father   . Hypertension Paternal Grandmother   . Hypertension Maternal Grandmother     History  Substance Use Topics  . Smoking status: Current Some Day Smoker -- 2 years    Types: Cigarettes  . Smokeless tobacco: Never Used  . Alcohol Use: Yes     Comment: social    OB History   Grav Para Term Preterm Abortions TAB SAB Ect Mult Living   2 2        2      Obstetric Comments   Cesarean section 2 yrs ago, and tubal ligation, with postop surgical complication(cyclic severe abd pain) with wound revieison  requiring repeat surgery at [facility] with no pathologic findings      Review of Systems  Constitutional: Negative for fever and chills.  HENT: Negative.   Eyes: Negative.   Respiratory: Negative.   Cardiovascular: Negative.   Gastrointestinal: Positive for nausea, vomiting, abdominal pain and diarrhea.  Genitourinary: Negative.   Musculoskeletal: Negative.   Skin: Negative.  Neurological: Negative.   Psychiatric/Behavioral: Negative.     Allergies  Review of patient's allergies indicates no known allergies.  Home Medications   Current Outpatient Rx  Name  Route  Sig  Dispense  Refill  . ketorolac (TORADOL) 10 MG tablet   Oral   Take 10 mg by mouth every 6 (six) hours as needed for pain. For pain         . medroxyPROGESTERone (PROVERA) 10 MG tablet      Pt to take 4 tablets by mouth daily(40mg  total)   120 tablet   6     BP 124/88  Pulse 66  Temp(Src) 97.2 F (36.2 C) (Oral)  Resp 20  SpO2 99%  LMP 11/24/2012  Physical Exam  Nursing note and vitals  reviewed. Constitutional: She is oriented to person, place, and time. She appears well-developed and well-nourished.  Acute distress, complaining of epigastric pain that radiates to the back.  HENT:  Head: Normocephalic and atraumatic.  Mouth/Throat: Oropharynx is clear and moist.  Eyes: Conjunctivae and EOM are normal. Pupils are equal, round, and reactive to light. No scleral icterus.  Neck: Normal range of motion. Neck supple.  Cardiovascular: Normal rate, regular rhythm and normal heart sounds.   Pulmonary/Chest: Effort normal and breath sounds normal.  Abdominal: Soft. She exhibits no distension. Tenderness: she has epigastric tenderness, no mass rebound or rigidity. There is no rebound and no guarding.  Musculoskeletal: Normal range of motion. She exhibits no edema and no tenderness.  No back pain, no palpable deformity of her back. No CVA tenderness.  Neurological: She is alert and oriented to person, place, and time.  No sensory or motor deficit.  Skin: Skin is warm and dry.  Psychiatric: She has a normal mood and affect. Her behavior is normal.    ED Course  Procedures (including critical care time)  Labs Reviewed  CBC WITH DIFFERENTIAL  COMPREHENSIVE METABOLIC PANEL  LIPASE, BLOOD  URINALYSIS, ROUTINE W REFLEX MICROSCOPIC  ETHANOL   4:15 PM Pt was seen and had physical exam.  IV fluids, IV medications for pain and nausea ordered.  Lab workup and acute abdominal X-rays ordered.  Old charts reviewed in E-chart.  She had diagnostic laparoscopy for pelvic pain last year.  5:26 PM CBC, chemistries, lipase, and ETOH negative.  Waiting for UA, acute abdominal x-ray series.  Still is nauseated.  Will try IV Phenergan.  8:10 PM UA and abdominal x-rays are negative.  Pt doing all right now.  Will try po liquids.  9:22 PM Tolerated liquids well.  Ready to go home.  Rx for Phenegan.  Released.  1. Viral gastroenteritis       Carleene Cooper III, MD 12/02/12 2027

## 2012-12-01 NOTE — ED Notes (Signed)
Pt given female urinal but unable to void at this time.

## 2012-12-01 NOTE — ED Notes (Signed)
Pt advised that a urine sample is needed to rule out pregnancy prior to x-ray being done.  Pt verbalized understanding.

## 2012-12-01 NOTE — ED Notes (Signed)
Pt states she is tolerating PO challenge well at this time.

## 2012-12-01 NOTE — ED Notes (Signed)
Pt given PO challenge. Instructed to call RN if she gets nauseated or starts to vomit.

## 2013-05-04 ENCOUNTER — Emergency Department (HOSPITAL_COMMUNITY)
Admission: EM | Admit: 2013-05-04 | Discharge: 2013-05-04 | Disposition: A | Discharge status: Home / Self Care | Payer: 59 | Attending: Emergency Medicine | Admitting: Emergency Medicine

## 2013-05-04 ENCOUNTER — Encounter (HOSPITAL_COMMUNITY): Payer: Self-pay | Admitting: *Deleted

## 2013-05-04 DIAGNOSIS — R109 Unspecified abdominal pain: Secondary | ICD-10-CM | POA: Insufficient documentation

## 2013-05-04 DIAGNOSIS — Z87891 Personal history of nicotine dependence: Secondary | ICD-10-CM | POA: Insufficient documentation

## 2013-05-04 DIAGNOSIS — N946 Dysmenorrhea, unspecified: Secondary | ICD-10-CM | POA: Insufficient documentation

## 2013-05-04 DIAGNOSIS — Z8742 Personal history of other diseases of the female genital tract: Secondary | ICD-10-CM | POA: Insufficient documentation

## 2013-05-04 DIAGNOSIS — Z8619 Personal history of other infectious and parasitic diseases: Secondary | ICD-10-CM | POA: Insufficient documentation

## 2013-05-04 DIAGNOSIS — Z3202 Encounter for pregnancy test, result negative: Secondary | ICD-10-CM | POA: Insufficient documentation

## 2013-05-04 DIAGNOSIS — Z87828 Personal history of other (healed) physical injury and trauma: Secondary | ICD-10-CM | POA: Insufficient documentation

## 2013-05-04 DIAGNOSIS — Z872 Personal history of diseases of the skin and subcutaneous tissue: Secondary | ICD-10-CM | POA: Insufficient documentation

## 2013-05-04 LAB — PREGNANCY, URINE: Preg Test, Ur: NEGATIVE

## 2013-05-04 LAB — BASIC METABOLIC PANEL
Calcium: 9 mg/dL (ref 8.4–10.5)
Creatinine, Ser: 0.57 mg/dL (ref 0.50–1.10)
GFR calc Af Amer: >90 mL/min (ref >90)
Sodium: 140 mEq/L (ref 135–145)

## 2013-05-04 LAB — CBC
MCH: 29.4 pg (ref 26.0–34.0)
MCV: 83.5 fL (ref 78.0–100.0)
Platelets: 265 10*3/uL (ref 150–400)
RDW: 12.6 % (ref 11.5–15.5)
WBC: 7.4 10*3/uL (ref 4.0–10.5)

## 2013-05-04 LAB — URINALYSIS, ROUTINE W REFLEX MICROSCOPIC
Ketones, ur: NEGATIVE mg/dL
Leukocytes, UA: NEGATIVE
Nitrite: NEGATIVE
Protein, ur: 30 mg/dL — AB
Urobilinogen, UA: 0.2 mg/dL (ref 0.0–1.0)

## 2013-05-04 LAB — URINE MICROSCOPIC-ADD ON

## 2013-05-04 MED ORDER — OXYCODONE-ACETAMINOPHEN 5-325 MG PO TABS
1.0000 | ORAL_TABLET | Freq: Once | ORAL | Status: AC
  Administered 2013-05-04: 1 via ORAL
  Filled 2013-05-04: qty 1

## 2013-05-04 MED ORDER — SODIUM CHLORIDE 0.9 % IV SOLN: 1000.0000 mL | INTRAVENOUS | Status: DC

## 2013-05-04 MED ORDER — HYDROMORPHONE HCL PF 1 MG/ML IJ SOLN
1.0000 mg | Freq: Once | INTRAMUSCULAR | Status: AC
  Administered 2013-05-04: 1 mg via INTRAVENOUS
  Filled 2013-05-04: qty 1

## 2013-05-04 MED ORDER — SODIUM CHLORIDE 0.9 % IV SOLN
1000.0000 mL | Freq: Once | INTRAVENOUS | Status: AC
  Administered 2013-05-04: 1000 mL via INTRAVENOUS

## 2013-05-04 MED ORDER — KETOROLAC TROMETHAMINE 30 MG/ML IJ SOLN
30.0000 mg | Freq: Once | INTRAMUSCULAR | Status: AC
  Administered 2013-05-04: 30 mg via INTRAVENOUS
  Filled 2013-05-04: qty 1

## 2013-05-04 MED ORDER — PROMETHAZINE HCL 25 MG/ML IJ SOLN
12.5000 mg | INTRAMUSCULAR | Status: AC
  Administered 2013-05-04: 12.5 mg via INTRAVENOUS
  Filled 2013-05-04: qty 1

## 2013-05-04 MED ORDER — PROMETHAZINE HCL 25 MG PO TABS: 25.0000 mg | ORAL_TABLET | Freq: Four times a day (QID) | ORAL | Status: DC | PRN

## 2013-05-04 MED ORDER — ONDANSETRON HCL 4 MG/2ML IJ SOLN
4.0000 mg | Freq: Once | INTRAMUSCULAR | Status: AC
  Administered 2013-05-04: 4 mg via INTRAVENOUS
  Filled 2013-05-04: qty 2

## 2013-05-04 MED ORDER — HYDROCODONE-ACETAMINOPHEN 5-325 MG PO TABS: 1.0000 | ORAL_TABLET | ORAL | Status: DC | PRN

## 2013-05-04 NOTE — ED Notes (Signed)
Patient continues having cramps and nausea with vomiting.

## 2013-05-04 NOTE — ED Notes (Signed)
Patient states she has cramps each related to cycle and that this morning the cramps started and are now worse than she has ever experienced. Patient is not bleeding at this time.

## 2013-05-04 NOTE — ED Provider Notes (Signed)
CSN: [id number]     Arrival date & time 05/04/13  1204 History     First MD Initiated Contact with Patient 05/04/13 1216     Chief Complaint  Patient presents with  . Abdominal Pain    HPI Patient presents with lower abdominal pain as well as nausea vomiting and began this morning.  She has a history of cramping as well as bleeding is heavy during her menses.  She's been followed by OB/GYN regarding this.  She had a laparoscopy performed in October 2013 to look for endometriosis.  There is no endometriosis found per the patient.  She has tried over-the-counter medications without improvement in her symptoms.  She is considering hysterectomy to better control her symptoms.  She is placed on Provera in the past which has improved her symptoms somewhat but currently she does not have any Provera home because she has not gone to the pharmacy to fill her prescription.  Her OB/GYN is Dr. Pennie Rushing.  Her symptoms are moderate in severity.  She received Zofran on arrival of emergency apartment however she states this is not helping with her nausea.  No fevers or chills.  No vaginal complaints otherwise.  No hematemesis.  No diarrhea.  Mild lower abdominal discomfort which is generalized across her lower abdomen    Past Medical History  Diagnosis Date  . Enlarged uterus   . Seroma   . Dysmenorrhea   . BV (bacterial vaginosis)   . HSV-2 infection   . Chlamydia   . Cellulitis 2011    c/s scar revision  . No pertinent past medical history    Past Surgical History  Procedure Laterality Date  . Cesarean section    . Cesarean section w/btl  2001,2010    Dr Pennie Rushing  . Revision of abdominal scar  2011    Dr Pennie Rushing at [facility], for cyclic abd pain  . Laparoscopy  07/26/2012    Procedure: LAPAROSCOPY OPERATIVE;  Surgeon: Hal Morales, MD;  Location: [facility];  Service: Gynecology;  Laterality: N/A;  Open Laparoscopy 90 minutes   Family History  Problem Relation Age of Onset  . Diabetes Maternal  Grandfather   . Diabetes Maternal Aunt   . Cancer Maternal Grandfather   . Hypertension Father   . Hypertension Paternal Grandmother   . Hypertension Maternal Grandmother    History  Substance Use Topics  . Smoking status: Former Smoker -- 2 years    Types: Cigarettes  . Smokeless tobacco: Never Used  . Alcohol Use: Yes     Comment: social   OB History   Grav Para Term Preterm Abortions TAB SAB Ect Mult Living   2 2        2      Obstetric Comments   Cesarean section 2 yrs ago, and tubal ligation, with postop surgical complication(cyclic severe abd pain) with wound revieison  requiring repeat surgery at [facility] with no pathologic findings     Review of Systems  All other systems reviewed and are negative.    Allergies  Review of patient's allergies indicates no known allergies.  Home Medications   Current Outpatient Rx  Name  Route  Sig  Dispense  Refill  . naproxen sodium (ANAPROX) 220 MG tablet   Oral   Take 220 mg by mouth once as needed (for pain).         Marland Kitchen HYDROcodone-acetaminophen (NORCO/VICODIN) 5-325 MG per tablet   Oral   Take 1 tablet by mouth  every 4 (four) hours as needed for pain.   15 tablet   0   . promethazine (PHENERGAN) 25 MG tablet   Oral   Take 1 tablet (25 mg total) by mouth every 6 (six) hours as needed for nausea.   15 tablet   0    BP 129/88  Pulse 79  Temp(Src) 97.5 F (36.4 C) (Oral)  Resp 18  Ht 5\' 2"  (1.575 m)  Wt 140 lb (63.504 kg)  BMI 25.6 kg/m2  SpO2 97%  LMP 04/04/2013 Physical Exam  Nursing note and vitals reviewed. Constitutional: She is oriented to person, place, and time. She appears well-developed and well-nourished. No distress.  HENT:  Head: Normocephalic and atraumatic.  Eyes: EOM are normal.  Neck: Normal range of motion.  Cardiovascular: Normal rate, regular rhythm and normal heart sounds.   Pulmonary/Chest: Effort normal and breath sounds normal.  Abdominal: Soft. She exhibits  no distension. There is no tenderness.  Musculoskeletal: Normal range of motion.  Neurological: She is alert and oriented to person, place, and time.  Skin: Skin is warm and dry.  Psychiatric: She has a normal mood and affect. Judgment normal.    ED Course   Procedures (including critical care time)  Labs Reviewed  BASIC METABOLIC PANEL - Abnormal; Notable for the following:    Glucose, Bld 105 (*)    All other components within normal limits  URINALYSIS, ROUTINE W REFLEX MICROSCOPIC - Abnormal; Notable for the following:    Color, Urine AMBER (*)    APPearance TURBID (*)    Bilirubin Urine LARGE (*)    Protein, ur 30 (*)    All other components within normal limits  URINE MICROSCOPIC-ADD ON - Abnormal; Notable for the following:    Squamous Epithelial / LPF FEW (*)    All other components within normal limits  CBC  PREGNANCY, URINE   No results found. 1. Abdominal pain   2. Dysmenorrhea     MDM  4:21 PM For now controlled.  Patient feels much better.  Much of this may be secondary to ongoing dysmenorrhea.  This has been a recurrent issue.  Discharge home with followup with her OB/GYN.  Home with Phenergan and Vicodin.  Lyanne Co, MD 05/04/13 [phone]

## 2013-05-04 NOTE — ED Notes (Signed)
Patient resting at this time after stating she is having less pain and nausea at this time.

## 2013-05-04 NOTE — ED Notes (Addendum)
Patient continues to have nausea

## 2013-05-04 NOTE — ED Notes (Addendum)
Patient continues to have cramping and nausea. Neva Seat, PA advised of patient condition and was given verbal order for percocet one tablet.

## 2013-05-04 NOTE — ED Notes (Signed)
Patient presents with abdominal pain and N/V that started this morning. Patient denies F/D. Hx of cramping and heavy bleeding during menzies and state this is worse she has ever experienced.

## 2013-08-07 ENCOUNTER — Emergency Department (HOSPITAL_COMMUNITY)
Admission: EM | Admit: 2013-08-07 | Discharge: 2013-08-07 | Disposition: A | Discharge status: Home / Self Care | Payer: 59 | Source: Home / Self Care | Attending: Family Medicine | Admitting: Family Medicine

## 2013-08-07 ENCOUNTER — Encounter (HOSPITAL_COMMUNITY): Payer: Self-pay | Admitting: Emergency Medicine

## 2013-08-07 DIAGNOSIS — S83419A Sprain of medial collateral ligament of unspecified knee, initial encounter: Secondary | ICD-10-CM

## 2013-08-07 DIAGNOSIS — S83412A Sprain of medial collateral ligament of left knee, initial encounter: Secondary | ICD-10-CM

## 2013-08-07 MED ORDER — DICLOFENAC POTASSIUM 50 MG PO TABS: 50.0000 mg | ORAL_TABLET | Freq: Three times a day (TID) | ORAL | Status: DC

## 2013-08-07 NOTE — ED Notes (Signed)
Left knee pain, onset late Tuesday night

## 2013-08-07 NOTE — ED Provider Notes (Addendum)
CSN: [id number]     Arrival date & time 08/07/13  1842 History   First MD Initiated Contact with Patient 08/07/13 1903     Chief Complaint  Patient presents with  . Knee Pain   (Consider location/radiation/quality/duration/timing/severity/associated sxs/prior Treatment) Patient is a 37 y.o. female presenting with knee pain. The history is provided by the patient.  Knee Pain Location:  Knee Time since incident:  2 days Injury: no   Knee location:  L knee Pain details:    Quality:  Burning   Radiates to:  Does not radiate   Severity:  Mild   Onset quality:  Sudden   Progression:  Unchanged Chronicity:  New (awoke during sleep with knee pain, continues sore on medial aspect.) Dislocation: no   Foreign body present:  No foreign bodies Prior injury to area:  No   Past Medical History  Diagnosis Date  . Enlarged uterus   . Seroma   . Dysmenorrhea   . BV (bacterial vaginosis)   . HSV-2 infection   . Chlamydia   . Cellulitis 2011    c/s scar revision  . No pertinent past medical history    Past Surgical History  Procedure Laterality Date  . Cesarean section    . Cesarean section w/btl  2001,2010    Dr Pennie Rushing  . Revision of abdominal scar  2011    Dr Pennie Rushing at [facility], for cyclic abd pain  . Laparoscopy  07/26/2012    Procedure: LAPAROSCOPY OPERATIVE;  Surgeon: Hal Morales, MD;  Location: [facility];  Service: Gynecology;  Laterality: N/A;  Open Laparoscopy 90 minutes   Family History  Problem Relation Age of Onset  . Diabetes Maternal Grandfather   . Diabetes Maternal Aunt   . Cancer Maternal Grandfather   . Hypertension Father   . Hypertension Paternal Grandmother   . Hypertension Maternal Grandmother    History  Substance Use Topics  . Smoking status: Former Smoker -- 2 years    Types: Cigarettes  . Smokeless tobacco: Never Used  . Alcohol Use: Yes     Comment: social   OB History   Grav Para Term Preterm Abortions TAB SAB Ect Mult Living   2 2        2       Obstetric Comments   Cesarean section 2 yrs ago, and tubal ligation, with postop surgical complication(cyclic severe abd pain) with wound revieison  requiring repeat surgery at [facility] with no pathologic findings     Review of Systems  Musculoskeletal: Negative for gait problem and joint swelling.    Allergies  Review of patient's allergies indicates no known allergies.  Home Medications   Current Outpatient Rx  Name  Route  Sig  Dispense  Refill  . diclofenac (CATAFLAM) 50 MG tablet   Oral   Take 1 tablet (50 mg total) by mouth 3 (three) times daily. For knee pain.   30 tablet   0   . HYDROcodone-acetaminophen (NORCO/VICODIN) 5-325 MG per tablet   Oral   Take 1 tablet by mouth every 4 (four) hours as needed for pain.   15 tablet   0   . naproxen sodium (ANAPROX) 220 MG tablet   Oral   Take 220 mg by mouth once as needed (for pain).         . promethazine (PHENERGAN) 25 MG tablet   Oral   Take 1 tablet (25 mg total) by mouth every 6 (six) hours as needed  for nausea.   15 tablet   0    BP 115/73  Pulse 73  Temp(Src) 98.9 F (37.2 C) (Oral)  Resp 18  SpO2 100% Physical Exam  Nursing note and vitals reviewed. Constitutional: She is oriented to person, place, and time. She appears well-developed and well-nourished.  Musculoskeletal: She exhibits tenderness.       Left knee: She exhibits MCL laxity. She exhibits normal range of motion, no swelling, no effusion, no LCL laxity, no bony tenderness and normal meniscus. Tenderness found. Medial joint line and MCL tenderness noted. No patellar tendon tenderness noted.       Legs: Neurological: She is alert and oriented to person, place, and time.  Skin: Skin is warm and dry.    ED Course  Procedures (including critical care time) Labs Review Labs Reviewed - No data to display Imaging Review No results found.    MDM      Linna Hoff, MD 08/07/13 4098  Linna Hoff,  MD 08/08/13 [phone]

## 2013-08-08 NOTE — ED Notes (Signed)
Patient called to advise she is not getting good relief from her pain w the Rx she was given. Spoke w Dr Artis Flock to discuss plan, and patient has now been advised she is to rest , elevate her leg, ice, use Rx ad prescribed, wear her brace and call her orthopaedist as previously instructed. 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

## 2013-09-12 ENCOUNTER — Ambulatory Visit (INDEPENDENT_AMBULATORY_CARE_PROVIDER_SITE_OTHER): Payer: 59 | Admitting: Family Medicine

## 2013-09-12 ENCOUNTER — Encounter: Payer: Self-pay | Admitting: Family Medicine

## 2013-09-12 VITALS — BP 120/73 | HR 74 | Ht 62.0 in | Wt 143.0 lb

## 2013-09-12 DIAGNOSIS — R5383 Other fatigue: Secondary | ICD-10-CM

## 2013-09-12 DIAGNOSIS — N946 Dysmenorrhea, unspecified: Secondary | ICD-10-CM

## 2013-09-12 DIAGNOSIS — G4700 Insomnia, unspecified: Secondary | ICD-10-CM

## 2013-09-12 DIAGNOSIS — R5381 Other malaise: Secondary | ICD-10-CM

## 2013-09-12 LAB — LIPID PANEL
Cholesterol: 169 mg/dL (ref 0–200)
HDL: 55 mg/dL (ref >39)
Total CHOL/HDL Ratio: 3.1 Ratio
Triglycerides: 69 mg/dL (ref <150)
VLDL: 14 mg/dL (ref 0–40)

## 2013-09-12 LAB — CBC WITH DIFFERENTIAL/PLATELET
Eosinophils Absolute: 0 10*3/uL (ref 0.0–0.7)
Eosinophils Relative: 1 % (ref 0–5)
HCT: 38.2 % (ref 36.0–46.0)
Lymphocytes Relative: 38 % (ref 12–46)
Lymphs Abs: 1.2 10*3/uL (ref 0.7–4.0)
MCH: 28.6 pg (ref 26.0–34.0)
MCV: 82.7 fL (ref 78.0–100.0)
Monocytes Absolute: 0.5 10*3/uL (ref 0.1–1.0)
Monocytes Relative: 14 % — ABNORMAL HIGH (ref 3–12)
Platelets: 282 10*3/uL (ref 150–400)
RBC: 4.62 MIL/uL (ref 3.87–5.11)
WBC: 3.2 10*3/uL — ABNORMAL LOW (ref 4.0–10.5)

## 2013-09-12 LAB — COMPREHENSIVE METABOLIC PANEL
ALT: <8 U/L (ref 0–35)
BUN: 10 mg/dL (ref 6–23)
CO2: 27 mEq/L (ref 19–32)
Calcium: 8.8 mg/dL (ref 8.4–10.5)
Chloride: 106 mEq/L (ref 96–112)
Creat: 0.67 mg/dL (ref 0.50–1.10)
Glucose, Bld: 91 mg/dL (ref 70–99)
Total Bilirubin: 0.3 mg/dL (ref 0.3–1.2)

## 2013-09-12 LAB — TSH: TSH: 0.297 u[IU]/mL — ABNORMAL LOW (ref 0.350–4.500)

## 2013-09-12 MED ORDER — TRAZODONE HCL 50 MG PO TABS: 50.0000 mg | ORAL_TABLET | Freq: Every evening | ORAL | Status: DC | PRN

## 2013-09-12 NOTE — Patient Instructions (Signed)
It was great to meet you!  4-6 weeks follow up

## 2013-09-12 NOTE — Assessment & Plan Note (Signed)
No clear etiology on exam or history. Discussed sleep hygiene as primary intervention, given prescription for trazodone to sleep hygiene has not helped. Check basic labs for annual health maintenance, will follow

## 2013-09-12 NOTE — Assessment & Plan Note (Signed)
Been followed by [facility] Patient currently being managed with Depo-Provera injections to reduce amenorrhea. Considering hysterectomy with her Ob

## 2013-09-12 NOTE — Progress Notes (Signed)
Patient ID: Judy Baird, female   DOB: October 14, 1975, 37 y.o.   MRN: [id number]  Kevin Fenton, MD Phone: [phone]  Subjective:  Chief complaint-noted  New patient to establish care  History on family history of sarcoidosis, concerned about her thyroid she was slightly hyperthyroid previously and used methimazole, has complaints of insomnia and fatigue today.  Insomnia and fatigue  States that she's felt generally fatigued for several months. She also states that for the last 3 months or so she's woken up every night about 3:30 AM. She's not had a period in several months as she is using Depo-Provera to manage her periods and dysmenorrhea.   On review of sleep hygiene she goes to sleep with her TV on every night, and her husband snores slightly. She does have a routine bedtime about 10 PM, and has a pretty good bedtime routine. She denies any major mood complaints including depression anxiety. She did states that she does have a busy life and that she enjoys it. 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  Her dysmenorrhea is managed by [facility], they offered a hysterectomy which she is considering strongly. - ROS-  Past Medical History Patient Active Problem List   Diagnosis Date Noted  . Fatigue 09/12/2013  . Insomnia 09/12/2013  . Enlarged uterus   . Dysmenorrhea   . HSV-2 infection     Medications- reviewed and updated Current Outpatient Prescriptions  Medication Sig Dispense Refill  . diclofenac (CATAFLAM) 50 MG tablet Take 1 tablet (50 mg total) by mouth 3 (three) times daily. For knee pain.  30 tablet  0  . HYDROcodone-acetaminophen (NORCO/VICODIN) 5-325 MG per tablet Take 1 tablet by mouth every 4 (four) hours as needed for pain.  15 tablet  0  . naproxen sodium (ANAPROX) 220 MG tablet Take 220 mg by mouth once as needed (for pain).      . promethazine (PHENERGAN) 25 MG tablet Take 1 tablet (25 mg total) by  mouth every 6 (six) hours as needed for nausea.  15 tablet  0  . traZODone (DESYREL) 50 MG tablet Take 1-2 tablets (50-100 mg total) by mouth at bedtime as needed for sleep.  60 tablet  3   No current facility-administered medications for this visit.    Objective: BP 120/73  Pulse 74  Ht 5\' 2"  (1.575 m)  Wt 143 lb (64.864 kg)  BMI 26.15 kg/m2 Gen: NAD, alert, cooperative with exam HEENT: NCAT, EOMI, PERRL CV: RRR, good S1/S2, no murmur Resp: CTABL, no wheezes, non-labored Abd: SNTND, BS present, no guarding or organomegaly Ext: No edema, warm Neuro: Alert and oriented, No gross deficits   Assessment/Plan:  Dysmenorrhea Been followed by [facility] Patient currently being managed with Depo-Provera injections to reduce amenorrhea. Considering hysterectomy with her Ob  Fatigue Most likely related to her insomnia Will check TSH and CBC to rule out thyroid dysfunction or anemia Discussed strategies to improve as sleep per insomnia assessment and plan.   Insomnia No clear etiology on exam or history. Discussed sleep hygiene as primary intervention, given prescription for trazodone to sleep hygiene has not helped. Check basic labs for annual health maintenance, will follow    Orders Placed This Encounter  Procedures  . TSH  . T3, Free  . T4, Free  . CBC with Differential  . Comprehensive metabolic panel  . Lipid Panel    Meds ordered this encounter  Medications  .  traZODone (DESYREL) 50 MG tablet    Sig: Take 1-2 tablets (50-100 mg total) by mouth at bedtime as needed for sleep.    Dispense:  60 tablet    Refill:  3

## 2013-09-12 NOTE — Assessment & Plan Note (Signed)
Most likely related to her insomnia Will check TSH and CBC to rule out thyroid dysfunction or anemia Discussed strategies to improve as sleep per insomnia assessment and plan.

## 2013-09-19 ENCOUNTER — Encounter: Payer: Self-pay | Admitting: Family Medicine

## 2014-02-06 ENCOUNTER — Encounter (HOSPITAL_COMMUNITY): Payer: Self-pay

## 2014-02-20 ENCOUNTER — Other Ambulatory Visit: Payer: Self-pay | Admitting: Obstetrics and Gynecology

## 2014-03-31 ENCOUNTER — Other Ambulatory Visit: Payer: Self-pay | Admitting: Obstetrics and Gynecology

## 2014-04-21 ENCOUNTER — Other Ambulatory Visit (HOSPITAL_COMMUNITY): Payer: Self-pay | Admitting: Obstetrics and Gynecology

## 2014-04-21 NOTE — H&P (Signed)
Judy Baird is a 38 y.o. female  P 2-0-0-2 presents for hysterectomy because of dysmenorrhea and menorrhagia.  For many years the patient reports severe dysmenorrhea that will double her over, cause nausea and often make her have to go to bed.  She rates her pain as a 10/10 on a 10 point pain scale and on occasion will decrease that pain to 7/10 on a 10 point pain scale with Ibuprofen 800 mg.  She describes her menses as a 6 day flow with pad changes every2-3 hours with occasional clots. She denies inter-menstrual bleeding, post-coital bleeding, changes in bowel or bladder function or vaginitis symptoms.  This past year the patient was placed on Depo Provera with good control of her bleeding but sub-optimal control of her cramps.  Though she has been amenorrheic, she still has cramping when she "would" have a period.  Her hemoglobin in September 2014 = 12.5. A review of both medical and surgical management options were reviewed with the patient and she has decided to proceed with definitive therapy in the form of hysterectomy.  Past Medical History  OB History: G:2;  P: 2-0-0-2;  C-section 2001 and 2010  GYN History: menarche : 38 YO;   LMP: none due to  Depo Provera;   Has a  history of abnormal PAP smear: CIN-II treated with LEEP (2000); History of HPV, HSV-2 and chlamydia;   Last PAP smear 2013  Medical History: Renal Stone, Hidradenitis Supprativa, Insomnia and Abnormal Thyroid Lab  Surgical History: 1998: Right ACL Repair; 2000:  LEEP Procedure;  2003: Right Ganglion Cyst Removal; 2004: Bilateral Axillary Excision of Hidradenitis; 2007: Pilonidal Cyst Excision;   2010: Tubal Sterilization and   2013:  Diagnostic  aparoscopy for Dysmenorrhea Denies problems with anesthesia or history of blood transfusions  Family History: Hypertension, Diabetes Mellitus, Asthma, Sarcoidosis, Endometriosis and Prostate Cancer  Social History:  Married and employed with [employer];  Denies tobacco use but admits  to occasional alcohol intake   Medications:  Depo Provera 150 mg IM every 12 weeks Diclofenac 50 mg 3 time a day with food Valacyclovir 500 mg  daily   No Known Allergies  Denies sensitivity to peanuts, shellfish, soy, latex or adhesives.   ROS: Admits to glasses and right shoulder pain but  denies headache, vision changes, nasal congestion, dysphagia, tinnitus, dizziness, hoarseness, cough,  chest pain, shortness of breath, nausea, vomiting, diarrhea,constipation,  urinary frequency, urgency  dysuria, hematuria, vaginitis symptoms,  swelling of joints,easy bruising,  myalgias, skin rashes, unexplained weight loss and except as is mentioned in the history of present illness, patient's review of systems is otherwise negative.  Physical Exam  Bp: 90/70   P: 60   R: 12  Temperature: 98.5 degrees F orally  Weight: 153 lbs. Height: 5\' 2"  BMI: 28  Neck: supple without masses or thyromegaly Lungs: clear to auscultation Heart: regular rate and rhythm Abdomen: soft, non-tender and no organomegaly Pelvic:EGBUS- wnl; vagina-normal rugae; uterus-normal size, cervix without lesions or motion tenderness; adnexae-no tenderness or masses Extremities:  no clubbing, cyanosis or edema  Endometrial Biopsy results-pending  Assesment: Dysmenorrhea            Menorrhagia   Disposition:  A discussion was held with patient regarding the indication for her procedure(s) along with the risks, which include but are not limited to: reaction to anesthesia, damage to adjacent organs, infection, excessive bleeding and open abdominal incision.  The paitent verbalized understanding of these risks and has consented to proceed with  a Laparoscopically Assisted Total Vaginal Hysterectomy with Bilateral Salpingectomy and [facility] on May 07, 2014 at 7:30 a.m.   CSN# [id number]   Starlit Raburn J. Lowell GuitarPowell, PA-C  for Dr. Maris BergerVanessa P. Haygood

## 2014-04-22 ENCOUNTER — Other Ambulatory Visit: Payer: Self-pay

## 2014-05-04 ENCOUNTER — Inpatient Hospital Stay (HOSPITAL_COMMUNITY): Admission: RE | Admit: 2014-05-04 | Payer: 59 | Source: Ambulatory Visit

## 2014-05-04 NOTE — Patient Instructions (Addendum)
   Your procedure is scheduled on:  Thursday, July 30  [facility] at:  6 AM Pick up the phone at the desk and dial [phone] and inform us of your arrival.  Please call this number if you have any problems the morning of surgery: [phone]  Remember: Do not eat or drink after midnight: Wednesday Take these medicines the morning of surgery with a SIP OF WATER:  None  Do not wear jewelry, make-up, or FINGER nail polish No metal in your hair or on your body. Do not wear lotions, powders, perfumes.  You may wear deodorant.  Do not bring valuables to the hospital. Contacts, dentures or bridgework may not be worn into surgery.  Leave suitcase in the car. After Surgery it may be brought to your room. For patients being admitted to the hospital, checkout time is 11:00am the day of discharge.    Patients discharged on the day of surgery will not be allowed to drive home.  Home with husband Judy Baird cell [phone] or mother Judy Baird cell [phone].

## 2014-05-05 ENCOUNTER — Encounter (HOSPITAL_COMMUNITY): Payer: Self-pay

## 2014-05-05 ENCOUNTER — Encounter (HOSPITAL_COMMUNITY)
Admission: RE | Admit: 2014-05-05 | Discharge: 2014-05-05 | Disposition: A | Discharge status: Home / Self Care | Payer: 59 | Source: Ambulatory Visit | Attending: Obstetrics and Gynecology | Admitting: Obstetrics and Gynecology

## 2014-05-05 LAB — CBC
HEMATOCRIT: 40.2 % (ref 36.0–46.0)
HEMOGLOBIN: 13.8 g/dL (ref 12.0–15.0)
MCH: 29.7 pg (ref 26.0–34.0)
MCHC: 34.3 g/dL (ref 30.0–36.0)
MCV: 86.6 fL (ref 78.0–100.0)
Platelets: 266 10*3/uL (ref 150–400)
RBC: 4.64 MIL/uL (ref 3.87–5.11)
RDW: 12.9 % (ref 11.5–15.5)
WBC: 6 10*3/uL (ref 4.0–10.5)

## 2014-05-06 MED ORDER — DEXTROSE 5 % IV SOLN
2.0000 g | INTRAVENOUS | Status: AC
  Administered 2014-05-07: 2 g via INTRAVENOUS
  Filled 2014-05-06: qty 2

## 2014-05-07 ENCOUNTER — Observation Stay (HOSPITAL_COMMUNITY)
Admission: RE | Admit: 2014-05-07 | Discharge: 2014-05-08 | Disposition: A | Discharge status: Home / Self Care | Payer: 59 | Source: Ambulatory Visit | Attending: Obstetrics and Gynecology | Admitting: Obstetrics and Gynecology

## 2014-05-07 ENCOUNTER — Encounter (HOSPITAL_COMMUNITY)
Admission: RE | Disposition: A | Discharge status: Home / Self Care | Payer: Self-pay | Source: Ambulatory Visit | Attending: Obstetrics and Gynecology

## 2014-05-07 ENCOUNTER — Ambulatory Visit (HOSPITAL_COMMUNITY): Payer: 59 | Admitting: Anesthesiology

## 2014-05-07 ENCOUNTER — Encounter (HOSPITAL_COMMUNITY): Payer: Self-pay | Admitting: Anesthesiology

## 2014-05-07 ENCOUNTER — Encounter (HOSPITAL_COMMUNITY): Payer: 59 | Admitting: Anesthesiology

## 2014-05-07 DIAGNOSIS — Z90710 Acquired absence of both cervix and uterus: Secondary | ICD-10-CM | POA: Diagnosis not present

## 2014-05-07 DIAGNOSIS — N946 Dysmenorrhea, unspecified: Principal | ICD-10-CM | POA: Insufficient documentation

## 2014-05-07 DIAGNOSIS — Z87891 Personal history of nicotine dependence: Secondary | ICD-10-CM | POA: Insufficient documentation

## 2014-05-07 DIAGNOSIS — N949 Unspecified condition associated with female genital organs and menstrual cycle: Secondary | ICD-10-CM | POA: Insufficient documentation

## 2014-05-07 DIAGNOSIS — N920 Excessive and frequent menstruation with regular cycle: Secondary | ICD-10-CM | POA: Insufficient documentation

## 2014-05-07 DIAGNOSIS — N8000 Endometriosis of the uterus, unspecified: Secondary | ICD-10-CM | POA: Insufficient documentation

## 2014-05-07 HISTORY — PX: LAPAROSCOPIC ASSISTED VAGINAL HYSTERECTOMY: SHX5398

## 2014-05-07 LAB — PREGNANCY, URINE: PREG TEST UR: NEGATIVE

## 2014-05-07 SURGERY — HYSTERECTOMY, VAGINAL, LAPAROSCOPY-ASSISTED
Anesthesia: General | Site: Abdomen

## 2014-05-07 MED ORDER — HEPARIN SODIUM (PORCINE) 5000 UNIT/ML IJ SOLN
INTRAMUSCULAR | Status: AC
  Filled 2014-05-07: qty 1

## 2014-05-07 MED ORDER — HYDROMORPHONE HCL PF 1 MG/ML IJ SOLN
INTRAMUSCULAR | Status: DC | PRN
  Administered 2014-05-07: 1 mg via INTRAVENOUS

## 2014-05-07 MED ORDER — LACTATED RINGERS IV SOLN
INTRAVENOUS | Status: DC
  Administered 2014-05-07 – 2014-05-08 (×2): via INTRAVENOUS

## 2014-05-07 MED ORDER — DEXAMETHASONE SODIUM PHOSPHATE 10 MG/ML IJ SOLN
INTRAMUSCULAR | Status: AC
  Filled 2014-05-07: qty 1

## 2014-05-07 MED ORDER — HYDROMORPHONE 0.3 MG/ML IV SOLN
INTRAVENOUS | Status: DC
  Administered 2014-05-07: 11 mg via INTRAVENOUS
  Administered 2014-05-07: 4.2 mg via INTRAVENOUS
  Administered 2014-05-07: 15:00:00 via INTRAVENOUS
  Administered 2014-05-08: 0.6 mg via INTRAVENOUS
  Administered 2014-05-08: 0.9 mg via INTRAVENOUS
  Administered 2014-05-08: 1.2 mg via INTRAVENOUS
  Filled 2014-05-07: qty 25

## 2014-05-07 MED ORDER — FENTANYL CITRATE 0.05 MG/ML IJ SOLN
INTRAMUSCULAR | Status: DC | PRN
  Administered 2014-05-07: 50 ug via INTRAVENOUS
  Administered 2014-05-07: 100 ug via INTRAVENOUS
  Administered 2014-05-07: 50 ug via INTRAVENOUS
  Administered 2014-05-07: 100 ug via INTRAVENOUS
  Administered 2014-05-07: 50 ug via INTRAVENOUS

## 2014-05-07 MED ORDER — NEOSTIGMINE METHYLSULFATE 10 MG/10ML IV SOLN
INTRAVENOUS | Status: AC
  Filled 2014-05-07: qty 1

## 2014-05-07 MED ORDER — VASOPRESSIN 20 UNIT/ML IJ SOLN
INTRAMUSCULAR | Status: AC
  Filled 2014-05-07: qty 1

## 2014-05-07 MED ORDER — GLYCOPYRROLATE 0.2 MG/ML IJ SOLN
INTRAMUSCULAR | Status: AC
  Filled 2014-05-07: qty 2

## 2014-05-07 MED ORDER — LIDOCAINE HCL (CARDIAC) 20 MG/ML IV SOLN
INTRAVENOUS | Status: AC
  Filled 2014-05-07: qty 5

## 2014-05-07 MED ORDER — HYDROMORPHONE HCL PF 1 MG/ML IJ SOLN
0.5000 mg | INTRAMUSCULAR | Status: DC | PRN
  Administered 2014-05-07: 0.5 mg via INTRAVENOUS

## 2014-05-07 MED ORDER — ROCURONIUM BROMIDE 100 MG/10ML IV SOLN
INTRAVENOUS | Status: DC | PRN
  Administered 2014-05-07 (×2): 10 mg via INTRAVENOUS
  Administered 2014-05-07: 50 mg via INTRAVENOUS

## 2014-05-07 MED ORDER — OXYCODONE-ACETAMINOPHEN 5-325 MG PO TABS
1.0000 | ORAL_TABLET | ORAL | Status: DC | PRN
  Administered 2014-05-08: 2 via ORAL
  Filled 2014-05-07: qty 2

## 2014-05-07 MED ORDER — MIDAZOLAM HCL 2 MG/2ML IJ SOLN
INTRAMUSCULAR | Status: AC
  Filled 2014-05-07: qty 2

## 2014-05-07 MED ORDER — BUPIVACAINE HCL (PF) 0.25 % IJ SOLN
INTRAMUSCULAR | Status: DC | PRN
  Administered 2014-05-07: 30 mL

## 2014-05-07 MED ORDER — DIPHENHYDRAMINE HCL 50 MG/ML IJ SOLN: 12.5000 mg | Freq: Four times a day (QID) | INTRAMUSCULAR | Status: DC | PRN

## 2014-05-07 MED ORDER — FENTANYL CITRATE 0.05 MG/ML IJ SOLN
25.0000 ug | INTRAMUSCULAR | Status: DC | PRN
  Administered 2014-05-07 (×3): 50 ug via INTRAVENOUS

## 2014-05-07 MED ORDER — FENTANYL CITRATE 0.05 MG/ML IJ SOLN
INTRAMUSCULAR | Status: AC
  Filled 2014-05-07: qty 5

## 2014-05-07 MED ORDER — PROPOFOL 10 MG/ML IV EMUL
INTRAVENOUS | Status: AC
  Filled 2014-05-07: qty 20

## 2014-05-07 MED ORDER — 0.9 % SODIUM CHLORIDE (POUR BTL) OPTIME
TOPICAL | Status: DC | PRN
  Administered 2014-05-07: 1000 mL

## 2014-05-07 MED ORDER — MIDAZOLAM HCL 5 MG/5ML IJ SOLN
INTRAMUSCULAR | Status: DC | PRN
  Administered 2014-05-07: 2 mg via INTRAVENOUS

## 2014-05-07 MED ORDER — ROCURONIUM BROMIDE 100 MG/10ML IV SOLN
INTRAVENOUS | Status: AC
  Filled 2014-05-07: qty 1

## 2014-05-07 MED ORDER — SODIUM CHLORIDE 0.9 % IJ SOLN
INTRAMUSCULAR | Status: AC
  Filled 2014-05-07: qty 100

## 2014-05-07 MED ORDER — MENTHOL 3 MG MT LOZG: 1.0000 | LOZENGE | OROMUCOSAL | Status: DC | PRN

## 2014-05-07 MED ORDER — ONDANSETRON HCL 4 MG/2ML IJ SOLN
INTRAMUSCULAR | Status: DC | PRN
  Administered 2014-05-07: 4 mg via INTRAVENOUS

## 2014-05-07 MED ORDER — SCOPOLAMINE 1 MG/3DAYS TD PT72
MEDICATED_PATCH | TRANSDERMAL | Status: AC
  Administered 2014-05-07: 1.5 mg via TRANSDERMAL
  Filled 2014-05-07: qty 1

## 2014-05-07 MED ORDER — PROPOFOL 10 MG/ML IV BOLUS
INTRAVENOUS | Status: DC | PRN
  Administered 2014-05-07: 200 mg via INTRAVENOUS

## 2014-05-07 MED ORDER — ONDANSETRON HCL 4 MG/2ML IJ SOLN: 4.0000 mg | Freq: Four times a day (QID) | INTRAMUSCULAR | Status: DC | PRN

## 2014-05-07 MED ORDER — DIPHENHYDRAMINE HCL 12.5 MG/5ML PO ELIX
12.5000 mg | ORAL_SOLUTION | Freq: Four times a day (QID) | ORAL | Status: DC | PRN
  Administered 2014-05-08: 12.5 mg via ORAL
  Filled 2014-05-07: qty 5

## 2014-05-07 MED ORDER — LACTATED RINGERS IV SOLN: INTRAVENOUS | Status: DC

## 2014-05-07 MED ORDER — ONDANSETRON HCL 4 MG/2ML IJ SOLN
INTRAMUSCULAR | Status: AC
  Filled 2014-05-07: qty 2

## 2014-05-07 MED ORDER — ESTRADIOL 0.1 MG/GM VA CREA
TOPICAL_CREAM | VAGINAL | Status: DC | PRN
  Administered 2014-05-07: 1 via VAGINAL

## 2014-05-07 MED ORDER — NALOXONE HCL 0.4 MG/ML IJ SOLN: 0.4000 mg | INTRAMUSCULAR | Status: DC | PRN

## 2014-05-07 MED ORDER — LACTATED RINGERS IR SOLN
Status: DC | PRN
  Administered 2014-05-07: 3000 mL

## 2014-05-07 MED ORDER — FENTANYL CITRATE 0.05 MG/ML IJ SOLN
INTRAMUSCULAR | Status: AC
  Administered 2014-05-07: 50 ug via INTRAVENOUS
  Filled 2014-05-07: qty 2

## 2014-05-07 MED ORDER — ESTRADIOL 0.1 MG/GM VA CREA
TOPICAL_CREAM | VAGINAL | Status: AC
  Filled 2014-05-07: qty 42.5

## 2014-05-07 MED ORDER — MEPERIDINE HCL 25 MG/ML IJ SOLN: 6.2500 mg | INTRAMUSCULAR | Status: DC | PRN

## 2014-05-07 MED ORDER — SODIUM CHLORIDE 0.9 % IJ SOLN: 9.0000 mL | INTRAMUSCULAR | Status: DC | PRN

## 2014-05-07 MED ORDER — KETOROLAC TROMETHAMINE 30 MG/ML IJ SOLN
30.0000 mg | Freq: Four times a day (QID) | INTRAMUSCULAR | Status: DC
  Administered 2014-05-07 – 2014-05-08 (×3): 30 mg via INTRAVENOUS
  Filled 2014-05-07 (×3): qty 1

## 2014-05-07 MED ORDER — IBUPROFEN 600 MG PO TABS: 600.0000 mg | ORAL_TABLET | Freq: Four times a day (QID) | ORAL | Status: DC | PRN

## 2014-05-07 MED ORDER — ONDANSETRON HCL 4 MG PO TABS: 4.0000 mg | ORAL_TABLET | Freq: Three times a day (TID) | ORAL | Status: DC | PRN

## 2014-05-07 MED ORDER — LACTATED RINGERS IV SOLN
INTRAVENOUS | Status: DC
  Administered 2014-05-07 (×2): via INTRAVENOUS

## 2014-05-07 MED ORDER — SCOPOLAMINE 1 MG/3DAYS TD PT72: 1.0000 | MEDICATED_PATCH | Freq: Once | TRANSDERMAL | Status: DC

## 2014-05-07 MED ORDER — HYDROMORPHONE HCL PF 1 MG/ML IJ SOLN
INTRAMUSCULAR | Status: AC
  Filled 2014-05-07: qty 1

## 2014-05-07 MED ORDER — FENTANYL CITRATE 0.05 MG/ML IJ SOLN
INTRAMUSCULAR | Status: AC
  Filled 2014-05-07: qty 2

## 2014-05-07 MED ORDER — VASOPRESSIN 20 UNIT/ML IJ SOLN
INTRAVENOUS | Status: DC | PRN
  Administered 2014-05-07: 10:00:00 via INTRAMUSCULAR

## 2014-05-07 MED ORDER — PROMETHAZINE HCL 25 MG/ML IJ SOLN
INTRAMUSCULAR | Status: AC
  Administered 2014-05-07: 6.25 mg via INTRAVENOUS
  Filled 2014-05-07: qty 1

## 2014-05-07 MED ORDER — PROMETHAZINE HCL 25 MG/ML IJ SOLN
6.2500 mg | INTRAMUSCULAR | Status: DC | PRN
  Administered 2014-05-07: 6.25 mg via INTRAVENOUS

## 2014-05-07 MED ORDER — KETOROLAC TROMETHAMINE 30 MG/ML IJ SOLN
INTRAMUSCULAR | Status: DC | PRN
  Administered 2014-05-07: 30 mg via INTRAVENOUS

## 2014-05-07 MED ORDER — DEXAMETHASONE SODIUM PHOSPHATE 4 MG/ML IJ SOLN
INTRAMUSCULAR | Status: DC | PRN
  Administered 2014-05-07: 4 mg via INTRAVENOUS

## 2014-05-07 MED ORDER — BUPIVACAINE HCL (PF) 0.25 % IJ SOLN
INTRAMUSCULAR | Status: AC
  Filled 2014-05-07: qty 30

## 2014-05-07 SURGICAL SUPPLY — 56 items
BLADE SURG 10 STRL SS (BLADE) ×3 IMPLANT
BLADE SURG 11 STRL SS (BLADE) ×6 IMPLANT
CABLE HIGH FREQUENCY MONO STRZ (ELECTRODE) ×3 IMPLANT
CATH ROBINSON RED A/P 16FR (CATHETERS) IMPLANT
CLOSURE WOUND 1/4 X3 (GAUZE/BANDAGES/DRESSINGS)
CLOTH BEACON ORANGE TIMEOUT ST (SAFETY) ×3 IMPLANT
CONT PATH 16OZ SNAP LID 3702 (MISCELLANEOUS) ×3 IMPLANT
COVER TABLE BACK 60X90 (DRAPES) ×3 IMPLANT
DECANTER SPIKE VIAL GLASS SM (MISCELLANEOUS) IMPLANT
DERMABOND ADVANCED (GAUZE/BANDAGES/DRESSINGS)
DERMABOND ADVANCED .7 DNX12 (GAUZE/BANDAGES/DRESSINGS) IMPLANT
DRAPE LG THREE QUARTER DISP (DRAPES) ×6 IMPLANT
DRSG COVADERM PLUS 2X2 (GAUZE/BANDAGES/DRESSINGS) ×6 IMPLANT
DRSG OPSITE 4X5.5 SM (GAUZE/BANDAGES/DRESSINGS) ×6 IMPLANT
DRSG OPSITE POSTOP 3X4 (GAUZE/BANDAGES/DRESSINGS) IMPLANT
DURAPREP 26ML APPLICATOR (WOUND CARE) ×3 IMPLANT
ELECT REM PT RETURN 9FT ADLT (ELECTROSURGICAL) ×3
ELECTRODE REM PT RTRN 9FT ADLT (ELECTROSURGICAL) ×1 IMPLANT
EVACUATOR SMOKE 8.L (FILTER) ×3 IMPLANT
FORCEPS CUTTING 33CM 5MM (CUTTING FORCEPS) IMPLANT
FORCEPS CUTTING 45CM 5MM (CUTTING FORCEPS) IMPLANT
GAUZE PACKING 2X5 YD STRL (GAUZE/BANDAGES/DRESSINGS) IMPLANT
GLOVE BIOGEL PI IND STRL 6.5 (GLOVE) ×1 IMPLANT
GLOVE BIOGEL PI INDICATOR 6.5 (GLOVE) ×2
GLOVE SURG SS PI 6.5 STRL IVOR (GLOVE) ×9 IMPLANT
GOWN STRL REUS W/ TWL LRG LVL3 (GOWN DISPOSABLE) ×7 IMPLANT
GOWN STRL REUS W/TWL LRG LVL3 (GOWN DISPOSABLE) ×14
NEEDLE MAYO .5 CIRCLE (NEEDLE) ×3 IMPLANT
NS IRRIG 1000ML POUR BTL (IV SOLUTION) ×3 IMPLANT
PACK LAVH (CUSTOM PROCEDURE TRAY) ×3 IMPLANT
PAD MAGNETIC INST (MISCELLANEOUS) ×3 IMPLANT
PROTECTOR NERVE ULNAR (MISCELLANEOUS) ×3 IMPLANT
SET IRRIG TUBING LAPAROSCOPIC (IRRIGATION / IRRIGATOR) IMPLANT
SHEARS HARMONIC ACE PLUS 36CM (ENDOMECHANICALS) IMPLANT
SOLUTION ANTI FOG 6CC (MISCELLANEOUS) ×3 IMPLANT
SOLUTION ELECTROLUBE (MISCELLANEOUS) IMPLANT
STRIP CLOSURE SKIN 1/4X3 (GAUZE/BANDAGES/DRESSINGS) IMPLANT
SUT CHROMIC 2 0 TIES 18 (SUTURE) IMPLANT
SUT VIC AB 0 CT1 18XCR BRD8 (SUTURE) ×3 IMPLANT
SUT VIC AB 0 CT1 8-18 (SUTURE) ×6
SUT VIC AB 2-0 SH 27 (SUTURE) ×4
SUT VIC AB 2-0 SH 27XBRD (SUTURE) ×2 IMPLANT
SUT VIC AB 3-0 PS2 18 (SUTURE) ×2
SUT VIC AB 3-0 PS2 18XBRD (SUTURE) ×1 IMPLANT
SUT VICRYL 0 ENDOLOOP (SUTURE) IMPLANT
SUT VICRYL 0 TIES 12 18 (SUTURE) ×3 IMPLANT
SUT VICRYL 0 UR6 27IN ABS (SUTURE) ×12 IMPLANT
SYR 20CC LL (SYRINGE) ×3 IMPLANT
SYR TB 1ML LUER SLIP (SYRINGE) ×2 IMPLANT
SYRINGE 60CC LL (MISCELLANEOUS) ×3 IMPLANT
TOWEL OR 17X24 6PK STRL BLUE (TOWEL DISPOSABLE) ×6 IMPLANT
TRAY FOLEY CATH 14FR (SET/KITS/TRAYS/PACK) ×3 IMPLANT
TROCAR BALL TOP DISP 5MM (ENDOMECHANICALS) ×3 IMPLANT
TROCAR XCEL DIL TIP R 11M (ENDOMECHANICALS) ×3 IMPLANT
WARMER LAPAROSCOPE (MISCELLANEOUS) ×3 IMPLANT
WATER STERILE IRR 1000ML POUR (IV SOLUTION) ×3 IMPLANT

## 2014-05-07 NOTE — Transfer of Care (Signed)
Immediate Anesthesia Transfer of Care Note  Patient: Judy Baird  Procedure(s) Performed: Procedure(s): LAPAROSCOPIC ASSISTED VAGINAL HYSTERECTOMY (N/A)  Patient Location: PACU  Anesthesia Type:General  Level of Consciousness: awake, alert , oriented and patient cooperative  Airway & Oxygen Therapy: Patient Spontanous Breathing and Patient connected to nasal cannula oxygen  Post-op Assessment: Report given to PACU RN, Post -op Vital signs reviewed and stable and Patient moving all extremities X 4  Post vital signs: Reviewed and stable  Complications: No apparent anesthesia complications

## 2014-05-07 NOTE — Progress Notes (Signed)
Vaginal procedure dirty

## 2014-05-07 NOTE — Anesthesia Preprocedure Evaluation (Signed)
Anesthesia Evaluation  Patient identified by MRN, date of birth, ID band Patient awake    Reviewed: Allergy & Precautions, H&P , NPO status , Patient's Chart, lab work & pertinent test results  Airway Mallampati: I TM Distance: >3 FB Neck ROM: Full    Dental no notable dental hx.    Pulmonary neg pulmonary ROS, former smoker,  breath sounds clear to auscultation  Pulmonary exam normal       Cardiovascular negative cardio ROS  Rhythm:Regular Rate:Normal     Neuro/Psych negative neurological ROS  negative psych ROS   GI/Hepatic negative GI ROS, Neg liver ROS,   Endo/Other  negative endocrine ROS  Renal/GU negative Renal ROS  negative genitourinary   Musculoskeletal negative musculoskeletal ROS (+)   Abdominal   Peds negative pediatric ROS (+)  Hematology negative hematology ROS (+)   Anesthesia Other Findings   Reproductive/Obstetrics negative OB ROS                           Anesthesia Physical Anesthesia Plan  ASA: I  Anesthesia Plan: General   Post-op Pain Management:    Induction: Intravenous  Airway Management Planned: Oral ETT  Additional Equipment:   Intra-op Plan:   Post-operative Plan: Extubation in OR  Informed Consent: I have reviewed the patients History and Physical, chart, labs and discussed the procedure including the risks, benefits and alternatives for the proposed anesthesia with the patient or authorized representative who has indicated his/her understanding and acceptance.   Dental advisory given  Plan Discussed with: CRNA  Anesthesia Plan Comments:         Anesthesia Quick Evaluation

## 2014-05-07 NOTE — Anesthesia Postprocedure Evaluation (Signed)
  Anesthesia Post Note  Patient: Judy Baird  Procedure(s) Performed: Procedure(s) (LRB): LAPAROSCOPIC ASSISTED VAGINAL HYSTERECTOMY (N/A)  Anesthesia type: GA  Patient location: PACU  Post pain: Pain level controlled  Post assessment: Post-op Vital signs reviewed  Last Vitals:  Filed Vitals:   05/07/14 1315  BP:   Pulse:   Temp:   Resp: 24    Post vital signs: Reviewed  Level of consciousness: sedated  Complications: No apparent anesthesia complications

## 2014-05-07 NOTE — H&P (Signed)
  History and Physical Interval Note:   05/07/2014   7:13 AM   Judy Baird  has presented today for surgery, with the diagnosis of Dysmenorrhea  The various methods of treatment have been discussed with the patient and family. After consideration of risks, benefits and other options for treatment, the patient has consented to  Procedure(s): LAPAROSCOPIC ASSISTED VAGINAL HYSTERECTOMY and salpingectomy with the possiblility of abdominal hysterectomy as a surgical intervention .  I have reviewed the patients' chart and labs.  Questions were answered to the patient's satisfaction.     Hal MoralesHAYGOOD,Corneilus Heggie P  MD

## 2014-05-08 ENCOUNTER — Encounter (HOSPITAL_COMMUNITY): Payer: Self-pay | Admitting: Obstetrics and Gynecology

## 2014-05-08 DIAGNOSIS — Z90710 Acquired absence of both cervix and uterus: Secondary | ICD-10-CM | POA: Diagnosis not present

## 2014-05-08 LAB — CBC
HEMATOCRIT: 33.7 % — AB (ref 36.0–46.0)
HEMOGLOBIN: 11.4 g/dL — AB (ref 12.0–15.0)
MCH: 28.9 pg (ref 26.0–34.0)
MCHC: 33.8 g/dL (ref 30.0–36.0)
MCV: 85.5 fL (ref 78.0–100.0)
Platelets: 243 10*3/uL (ref 150–400)
RBC: 3.94 MIL/uL (ref 3.87–5.11)
RDW: 12.6 % (ref 11.5–15.5)
WBC: 13.3 10*3/uL — ABNORMAL HIGH (ref 4.0–10.5)

## 2014-05-08 MED ORDER — IBUPROFEN 600 MG PO TABS: ORAL_TABLET | ORAL | Status: DC

## 2014-05-08 MED ORDER — ONDANSETRON HCL 4 MG PO TABS: 4.0000 mg | ORAL_TABLET | Freq: Three times a day (TID) | ORAL | Status: DC | PRN

## 2014-05-08 MED ORDER — OXYCODONE-ACETAMINOPHEN 5-325 MG PO TABS: 1.0000 | ORAL_TABLET | ORAL | Status: DC | PRN

## 2014-05-08 NOTE — Anesthesia Postprocedure Evaluation (Signed)
Anesthesia Post Note  Patient: Judy Baird  Procedure(s) Performed: Procedure(s) (LRB): LAPAROSCOPIC ASSISTED VAGINAL HYSTERECTOMY (N/A)  Anesthesia type: General  Patient location: Mother/Baby  Post pain: Pain level controlled  Post assessment: Post-op Vital signs reviewed  Last Vitals:  Filed Vitals:   05/08/14 0506  BP: 97/52  Pulse: 90  Temp: 36.7 C  Resp: 12    Post vital signs: Reviewed  Level of consciousness: awake and alert   Complications: No apparent anesthesia complications

## 2014-05-08 NOTE — Discharge Summary (Signed)
Physician Discharge Summary  Patient ID: Judy Baird MRN: [id number] DOB/AGE: 36/30/1977 36 y.o.  Admit date: 05/07/2014 Discharge date: 05/12/2014   Discharge Diagnoses:  Menorrhagia, Dysmenorrhea and Pelvic Adhesions Principal Problem:   S/P laparoscopic assisted vaginal hysterectomy (LAVH) Active Problems:   Dysmenorrhea   Menorrhagia   Operation: Laparoscopically Assisted Total Vaginal Hysterectomy with Bilateral Salpingectomy  Discharged Condition: Good  Hospital Course: On the date of admission the patient underwent the aforementioned procedures and tolerated them well.  Post operative course was unremarkable with the patient  tolerating a hemoglobin of 11.4 and resuming bowel and bladder function by post operative day #1. Having received the maximum benefit of her hospital stay she was discharged  home on post operative day #1.  Disposition: 01-Home or Self Care  Discharge Medications:    Medication List         ibuprofen 600 MG tablet  Commonly known as:  ADVIL,MOTRIN  1   po  pc every 6 hours for 5 days then as needed for pain     ondansetron 4 MG tablet  Commonly known as:  ZOFRAN  Take 1 tablet (4 mg total) by mouth every 8 (eight) hours as needed for nausea or vomiting.     oxyCODONE-acetaminophen 5-325 MG per tablet  Commonly known as:  PERCOCET/ROXICET  Take 1-2 tablets by mouth every 4 (four) hours as needed for severe pain (moderate to severe pain (when tolerating fluids)).     valACYclovir 500 MG tablet  Commonly known as:  VALTREX  Take 1 tablet (500 mg total) by mouth daily.         Follow-up: Dr. Erie NoeVanessa P. Emiliya Chretien on June 04, 2014 at 9:15 a.m.    SignedHal Morales: [name] , PA-C  05/12/2014, 9:34 AM

## 2014-05-08 NOTE — Addendum Note (Signed)
Addendum created 05/08/14 [id number] by Jhonnie GarnerBeth M Lannie Yusuf, CRNA   Modules edited: Notes Section   Notes Section:  File: [id number]

## 2014-05-08 NOTE — Progress Notes (Signed)
Judy Baird is a38 y.o.  [id number]  Post Op Date # 1:  LAVH/BS  Subjective: Patient is Doing well postoperatively. Patient has minimal pain with PCA Dilaudid.  Is tolerating liquids, ambulating in the hallls and has voided.-no flatus.  Objective: Vital signs in last 24 hours: Temp:  [97.4 F (36.3 C)-98.4 F (36.9 C)] 98 F (36.7 C) (07/31 0506) Pulse Rate:  [65-93] 90 (07/31 0506) Resp:  [10-28] 12 (07/31 0506) BP: (95-140)/(52-90) 97/52 mmHg (07/31 0506) SpO2:  [100 %] 100 % (07/31 0506) Weight:  [151 lb (68.493 kg)] 151 lb (68.493 kg) (07/30 1935)  Intake/Output from previous day: 07/30 0701 - 07/31 0700 In: 5414.6 [P.O.:860; I.V.:4554.6] Out: 2250 [Urine:2050] Intake/Output this shift: Total I/O In: 2714.6 [P.O.:860; I.V.:1854.6] Out: 1375 [Urine:1375]  Recent Labs Lab 05/05/14 1435 05/08/14 0513  WBC 6.0 13.3*  HGB 13.8 11.4*  HCT 40.2 33.7*  PLT 266 243    No results found for this basename: NA, K, CL, CO2, BUN, CREATININE, CALCIUM, LABALBU, PROT, BILITOT, ALKPHOS, ALT, AST, GLUCOSE,  in the last 168 hours  EXAM: General: alert, cooperative and no distress Resp: clear to auscultation bilaterally Cardio: regular rate and rhythm, S1, S2 normal, no murmur, click, rub or gallop GI: Decresed bowel sounds, dressings clean/dry and intact. Extremities: SCD hose on and functioning; no calft tenderness and negative Homan's   Assessment: s/p Procedure(s): LAPAROSCOPIC ASSISTED VAGINAL HYSTERECTOMY: stable and progressing well  Plan: Advance diet Encourage ambulation Advance to PO medication Probable discharge home later today.  Addendum:  By the time of my visit at about 8 am, the pt was tolerating a regular diet, had active bowel sounds, and was requesting oral pain med since her PCA was discontinued. She had reached each one of the parameters that allowed her to be discharged home  LOS: 1 day    POWELL,ELMIRA, PA-C 05/08/2014 6:59 AM

## 2014-05-08 NOTE — Progress Notes (Signed)
Pt discharged to home with significant other.  Condition stable.  Pt ambulated to car with L. Snyder, NT.  No equipment for home ordered at discharge. 

## 2014-05-08 NOTE — Discharge Instructions (Signed)
Call [facility] @ [phone] if:  You have a temperature greater than or equal to 100.4 degrees Farenheit orally You have pain that is not made better by the pain medication given and taken as directed You have excessive bleeding or problems urinating  Take Colace (Docusate Sodium/Stool Softener) 100 mg 2-3 times daily while taking narcotic pain medicine to avoid constipation or until bowel movements are regular. Take Ibuprofen 600 mg with food every 6 hours for 5 days then as needed for pain  You may drive after 2 weeks You may walk up steps  You may shower tomorrow You may resume a regular diet  Keep incisions clean and dry Do not lift over 15 pounds for 6 weeks Avoid anything in vagina for 6 weeks (or until after your post-operative visit)  Keep follow up appointment with Dr. Pennie RushingHaygood on June 04, 2014 at 9:15 a.m.

## 2014-05-09 MED FILL — Heparin Sodium (Porcine) Inj 5000 Unit/ML: INTRAMUSCULAR | Qty: 1 | Status: AC

## 2014-05-09 NOTE — Op Note (Signed)
Laparoscopically assisted vaginal hysterectomy with bilateral salpingectomy  Indications: The patient is a 38 y.o. female with dysmenorrhea, menorrhagia and pelvic pain  Pre-operative Diagnosis: Dysmenorrhea, menorrhagia  Post-operative Diagnosis: Dysmenorrhea, menorrhagia  Surgeon: Dierdre Forth P   Assistants:  Henreitta Leber certified physician Asst.  Anesthesia: General endotracheal anesthesia  ASA Class: 2  Procedure Details  The patient was seen in the Holding Room. The risks, benefits, complications, treatment options, and expected outcomes were discussed with the patient. The possibilities of reaction to medication, pulmonary aspiration, perforation of viscus, bleeding, recurrent infection, the need for additional procedures, failure to diagnose a condition, and creating a complication requiring transfusion or operation were discussed with the patient. The patient concurred with the proposed plan, giving informed consent. The patient was taken to the Operating Room, identified as Judy Baird and the procedure verified as laparoscopically assisted vaginal hysterectomy, bilateral salpingectomy, possible total abdominal hysterectomy with bilateral salpingectomy.  The patient had SCD hose in place, and had been treated with appropriate preoperative antibiotics.. A Time Out was held and the above information confirmed.  After induction of general anesthesia, the patient was placed in modified dorsal lithotomy position where she had abdominal and perineal and vaginal prepping then was draped and catheterized in the normal, sterile fashion  The cervix was visualized and an intrauterine manipulator was placed. The subumbilical and suprapubic areas were infiltrated with a total of 10 cc of quarter percent Marcaine. A 12 mm umbilical incision was then performed.  The incision was taken down to the level of the fascia and the fascia incised. The peritoneum was entered and the [facility] cannula  placed into the cavity. Under direct visualization after the peritoneum and fascia had been held with sutures of 0 Vicryl on either side of the incision. Suprapubic incisions were made to the right and left of midline and laparoscopic probe trocar is, 15 Miller meter, and one 10 mm were placed into the peritoneal cavity. Under direct visualization. The above findings were noted.  The utero-ovarian ligament on the right side was identified, cauterized, and incised. The right round ligament was then cauterized and incised. Anteriorly the broad ligament was incised toward the bladder. The right ureter was identified. The uterotubal anastomosis contained only a small portion of tube as the patient had undergone tubal sterilization.  The cornual region was the cauterized and cut from its pelvic connections. A similar procedure was carried out on the left side with the utero-ovarian ligament, round ligament, cornual region and the anterior leaf of the birth. Broad ligament was incised to meet the opposite side.  Preparations were then made for the vaginal portion of the procedure. A weighted speculum was placed in the posterior vagina and Lahey tenaculum placed on the anterior and posterior surfaces of the cervix. The cervical vaginal junction was infiltrated with a dilute solution of Pitressin. Cervix was circumscribed. The anterior vaginal mucosa was dissected off the anterior cervix and the anterior cul-de-sac entered sharply. A bladder blade was placed to elevate the bladder. The posterior cul-de-sac was entered sharply and tagged with a suture and then held. Uterosacral ligaments on the right and left side were clamped, cut, suture ligated, and no sutures held. The paracervical tissues, and parametrial tissues, as well as the uterine arteries on the right and left side were clamped, cut, and suture ligated. The uterus was inverted and the last lateral connections of the uterus clamped, cut, and suture ligated,  allowing removal of the uterus and cervix. The right ovary could  be easily identified, so that the fimbriated end of the remaining fallopian tube was grasped in a Kelly clamp and the tube excised. The base was then suture ligated with achievement of adequate hemostasis. The left ovary could not easily be accessed through the vagina.  Hemostasis was achieved and the [facility] suture placed in the posterior cul-de-sac incorporating the uterosacral ligaments on either side and the intervening posterior peritoneum. The vaginal angles were created with the suture from the uterosacral ligaments being placed in the anterior and posterior portions of the vaginal cuff and tied down. The remainder of the vaginal cuff was closed with interrupted sutures in a figure-of-eight fashion. With adequate hemostasis noted. All sutures used were 0 Vicryl. Vaginal packing moistened with Estrace cream was placed. The surgeon changed gown and gloves and returned to the laparoscopic instruments. The pelvis was visualized and a single area of oozing on the left pelvic sidewall treated with cautery. For adequate hemostasis. The left ovary with its remaining attached tube was identified. The tube was elevated and the knee salpinx cauterized and cut along its ovarian attachment. The fimbriated end of that tube was then removed through the umbilical trocar sleeve. Copious irrigation was carried out and hemostasis noted to be adequate. The laparoscopic trochars that had been placed suprapubically were removed under direct visualization as the CO2 was allowed to escape. The Hassan cannula was removed and the holding sutures used to close the subumbilical fascia with 2 figure-of-eight sutures appear the suprapubic incision containing the 10 mm trocar was closed with a fascial suture of 0 Vicryl. Then an adhesive skin. Dressing. That same dressing was applied to the 5 mm port. A subumbilical skin incision was closed with subcuticular  sutures of 3-0 Monocryl. The patient was awakened from general anesthesia and taken to the recovery room in satisfactory condition having tolerated the procedure well with sponge, and instrument counts correct.  Following the procedure the umbilical sheath was removed after intra-abdominal carbon dioxide was expressed. The incision was closed with subcutaneous and subcuticular sutures of 4-0 Vicryl. The intrauterine manipulator was then removed.  Instrument, sponge, and needle counts were correct prior to abdominal closure and at the conclusion of the case.   Findings: The anterior cul-de-sac and round ligaments . Some scarring along the attachment of the bladder to the anterior cervix. Status post cesarean section x2. There were no stigmata of endometriosis The uterus , normal size without evidence of endometriosis or uterine fibroid The adnexa . The tubes were status post tubal sterilization.  The ovaries were normal bilaterally, without stigmata of endometriosis Cul-de-sac . No adhesions or stigmata of endometriosis  Estimated Blood Loss:  200 mL         Drains: None         Specimens: Uterus and cervix, right and left portions of fallopian tube    sent to pathology          Complications:  None; patient tolerated the procedure well.         Disposition: PACU - hemodynamically stable.         Condition: stable

## 2014-05-12 MED ORDER — VALACYCLOVIR HCL 500 MG PO TABS: 500.0000 mg | ORAL_TABLET | Freq: Every day | ORAL | Status: DC

## 2014-07-31 ENCOUNTER — Emergency Department (HOSPITAL_BASED_OUTPATIENT_CLINIC_OR_DEPARTMENT_OTHER): Payer: 59

## 2014-07-31 ENCOUNTER — Encounter (HOSPITAL_BASED_OUTPATIENT_CLINIC_OR_DEPARTMENT_OTHER): Payer: Self-pay | Admitting: Emergency Medicine

## 2014-07-31 ENCOUNTER — Emergency Department (HOSPITAL_BASED_OUTPATIENT_CLINIC_OR_DEPARTMENT_OTHER)
Admission: EM | Admit: 2014-07-31 | Discharge: 2014-07-31 | Disposition: A | Discharge status: Home / Self Care | Payer: 59 | Attending: Emergency Medicine | Admitting: Emergency Medicine

## 2014-07-31 DIAGNOSIS — K529 Noninfective gastroenteritis and colitis, unspecified: Secondary | ICD-10-CM | POA: Diagnosis not present

## 2014-07-31 DIAGNOSIS — R1030 Lower abdominal pain, unspecified: Secondary | ICD-10-CM | POA: Diagnosis present

## 2014-07-31 DIAGNOSIS — Z90710 Acquired absence of both cervix and uterus: Secondary | ICD-10-CM | POA: Diagnosis not present

## 2014-07-31 DIAGNOSIS — Z8619 Personal history of other infectious and parasitic diseases: Secondary | ICD-10-CM | POA: Insufficient documentation

## 2014-07-31 DIAGNOSIS — Z872 Personal history of diseases of the skin and subcutaneous tissue: Secondary | ICD-10-CM | POA: Insufficient documentation

## 2014-07-31 DIAGNOSIS — Z8742 Personal history of other diseases of the female genital tract: Secondary | ICD-10-CM | POA: Insufficient documentation

## 2014-07-31 DIAGNOSIS — R109 Unspecified abdominal pain: Secondary | ICD-10-CM

## 2014-07-31 DIAGNOSIS — Z87828 Personal history of other (healed) physical injury and trauma: Secondary | ICD-10-CM | POA: Diagnosis not present

## 2014-07-31 DIAGNOSIS — Z79899 Other long term (current) drug therapy: Secondary | ICD-10-CM | POA: Diagnosis not present

## 2014-07-31 LAB — URINALYSIS, ROUTINE W REFLEX MICROSCOPIC
Bilirubin Urine: NEGATIVE
GLUCOSE, UA: NEGATIVE mg/dL
HGB URINE DIPSTICK: NEGATIVE
Ketones, ur: NEGATIVE mg/dL
LEUKOCYTES UA: NEGATIVE
Nitrite: NEGATIVE
Protein, ur: 30 mg/dL — AB
Specific Gravity, Urine: >1.046 — ABNORMAL HIGH (ref 1.005–1.030)
Urobilinogen, UA: 0.2 mg/dL (ref 0.0–1.0)
pH: 8.5 — ABNORMAL HIGH (ref 5.0–8.0)

## 2014-07-31 LAB — COMPREHENSIVE METABOLIC PANEL
ALT: 8 U/L (ref 0–35)
AST: 28 U/L (ref 0–37)
Albumin: 4.5 g/dL (ref 3.5–5.2)
Alkaline Phosphatase: 118 U/L — ABNORMAL HIGH (ref 39–117)
Anion gap: 21 — ABNORMAL HIGH (ref 5–15)
BUN: 8 mg/dL (ref 6–23)
CO2: 18 mEq/L — ABNORMAL LOW (ref 19–32)
Calcium: 10.1 mg/dL (ref 8.4–10.5)
Chloride: 102 mEq/L (ref 96–112)
Creatinine, Ser: 0.6 mg/dL (ref 0.50–1.10)
GFR calc Af Amer: >90 mL/min (ref >90)
GLUCOSE: 137 mg/dL — AB (ref 70–99)
Potassium: 4.1 mEq/L (ref 3.7–5.3)
SODIUM: 141 meq/L (ref 137–147)
TOTAL PROTEIN: 9.3 g/dL — AB (ref 6.0–8.3)
Total Bilirubin: 0.3 mg/dL (ref 0.3–1.2)

## 2014-07-31 LAB — CBC WITH DIFFERENTIAL/PLATELET
BASOS PCT: 0 % (ref 0–1)
Basophils Absolute: 0 10*3/uL (ref 0.0–0.1)
EOS ABS: 0 10*3/uL (ref 0.0–0.7)
EOS PCT: 0 % (ref 0–5)
HCT: 42.6 % (ref 36.0–46.0)
HEMOGLOBIN: 14.7 g/dL (ref 12.0–15.0)
LYMPHS ABS: 1.5 10*3/uL (ref 0.7–4.0)
Lymphocytes Relative: 21 % (ref 12–46)
MCH: 28.9 pg (ref 26.0–34.0)
MCHC: 34.5 g/dL (ref 30.0–36.0)
MCV: 83.7 fL (ref 78.0–100.0)
MONO ABS: 0.6 10*3/uL (ref 0.1–1.0)
MONOS PCT: 9 % (ref 3–12)
NEUTROS PCT: 70 % (ref 43–77)
Neutro Abs: 4.9 10*3/uL (ref 1.7–7.7)
Platelets: 346 10*3/uL (ref 150–400)
RBC: 5.09 MIL/uL (ref 3.87–5.11)
RDW: 12.3 % (ref 11.5–15.5)
WBC: 7 10*3/uL (ref 4.0–10.5)

## 2014-07-31 LAB — URINE MICROSCOPIC-ADD ON

## 2014-07-31 LAB — LIPASE, BLOOD: Lipase: 15 U/L (ref 11–59)

## 2014-07-31 MED ORDER — IOHEXOL 300 MG/ML  SOLN
100.0000 mL | Freq: Once | INTRAMUSCULAR | Status: AC | PRN
  Administered 2014-07-31: 100 mL via INTRAVENOUS

## 2014-07-31 MED ORDER — FENTANYL CITRATE 0.05 MG/ML IJ SOLN
50.0000 ug | Freq: Once | INTRAMUSCULAR | Status: AC
  Administered 2014-07-31: 13:00:00 via INTRAVENOUS

## 2014-07-31 MED ORDER — IOHEXOL 300 MG/ML  SOLN
25.0000 mL | Freq: Once | INTRAMUSCULAR | Status: AC | PRN
  Administered 2014-07-31: 25 mL via ORAL

## 2014-07-31 MED ORDER — HYDROMORPHONE HCL 1 MG/ML IJ SOLN
1.0000 mg | Freq: Once | INTRAMUSCULAR | Status: AC
  Administered 2014-07-31: 1 mg via INTRAVENOUS
  Filled 2014-07-31: qty 1

## 2014-07-31 MED ORDER — ONDANSETRON HCL 4 MG/2ML IJ SOLN
INTRAMUSCULAR | Status: AC
  Administered 2014-07-31: 4 mg via INTRAVENOUS
  Filled 2014-07-31: qty 2

## 2014-07-31 MED ORDER — FENTANYL CITRATE 0.05 MG/ML IJ SOLN
INTRAMUSCULAR | Status: AC
  Filled 2014-07-31: qty 2

## 2014-07-31 MED ORDER — ONDANSETRON HCL 4 MG/2ML IJ SOLN
4.0000 mg | Freq: Once | INTRAMUSCULAR | Status: AC
  Administered 2014-07-31: 4 mg via INTRAVENOUS

## 2014-07-31 MED ORDER — PROMETHAZINE HCL 25 MG PO TABS: 25.0000 mg | ORAL_TABLET | Freq: Four times a day (QID) | ORAL | Status: DC | PRN

## 2014-07-31 MED ORDER — PROMETHAZINE HCL 25 MG/ML IJ SOLN
12.5000 mg | Freq: Once | INTRAMUSCULAR | Status: AC
  Administered 2014-07-31: 15:00:00 via INTRAVENOUS
  Filled 2014-07-31: qty 1

## 2014-07-31 MED ORDER — SODIUM CHLORIDE 0.9 % IV BOLUS (SEPSIS)
1000.0000 mL | Freq: Once | INTRAVENOUS | Status: AC
  Administered 2014-07-31: 1000 mL via INTRAVENOUS

## 2014-07-31 MED ORDER — HYDROCODONE-ACETAMINOPHEN 5-325 MG PO TABS: 1.0000 | ORAL_TABLET | Freq: Four times a day (QID) | ORAL | Status: DC | PRN

## 2014-07-31 MED ORDER — HYDROCODONE-ACETAMINOPHEN 5-325 MG PO TABS
1.0000 | ORAL_TABLET | Freq: Four times a day (QID) | ORAL | Status: DC | PRN
Start: 2014-07-31 — End: 2014-07-31

## 2014-07-31 MED ORDER — ONDANSETRON HCL 4 MG/2ML IJ SOLN
4.0000 mg | Freq: Once | INTRAMUSCULAR | Status: AC
  Administered 2014-07-31: 4 mg via INTRAVENOUS
  Filled 2014-07-31: qty 2

## 2014-07-31 MED ORDER — SODIUM CHLORIDE 0.9 % IV SOLN
INTRAVENOUS | Status: DC
  Administered 2014-07-31: 16:00:00 via INTRAVENOUS

## 2014-07-31 NOTE — Discharge Instructions (Signed)
Symptoms seem to be consistent with a viral infection. Take the antinausea medicine and pain medicine as needed. Work note provided. Increase fluids advance to bland diet when tolerating fluids. Return for any new or worse symptoms.

## 2014-07-31 NOTE — ED Provider Notes (Signed)
CSN: [id number]     Arrival date & time 07/31/14  1150 History   First MD Initiated Contact with Patient 07/31/14 1339     Chief Complaint  Patient presents with  . Abdominal Pain     (Consider location/radiation/quality/duration/timing/severity/associated sxs/prior Treatment) Patient is a 38 y.o. female presenting with abdominal pain. The history is provided by the patient.  Abdominal Pain Associated symptoms: diarrhea, nausea and vomiting   Associated symptoms: no chest pain, no dysuria, no fever, no shortness of breath, no vaginal bleeding and no vaginal discharge    patient with acute onset of lower quadrant abdominal pain associated with nausea 6:30 this morning and some loose bowel movements. Patient has vomited about 10 times no blood. No blood in the bowel movements. Abdominal pain is bilateral lower quadrants. Patient states that abdominal pain is 8/10. [facility]. Past medical history is only significant for hysterectomy. Patient denies any dysuria fevers vaginal bleeding or vaginal discharge. Patient denies back pain.  Past Medical History  Diagnosis Date  . Enlarged uterus   . Seroma   . Dysmenorrhea   . BV (bacterial vaginosis)   . HSV-2 infection   . Chlamydia   . Cellulitis 2011    c/s scar revision  . No pertinent past medical history    Past Surgical History  Procedure Laterality Date  . Cesarean section  2001, 2010  . Cesarean section w/btl  2001,2010    Dr Pennie RushingHaygood  . Revision of abdominal scar  2011    [facility], for cyclic abd pain  . Laparoscopy  07/26/2012    Procedure: LAPAROSCOPY OPERATIVE;  Surgeon: Hal MoralesVanessa P Haygood, MD;  Location: [facility];  Service: Gynecology;  Laterality: N/A;  Open Laparoscopy 90 minutes  . Tubal ligation    . Right knee surgery      acl reconstruction  . Laparoscopic assisted vaginal hysterectomy N/A 05/07/2014    Procedure: LAPAROSCOPIC ASSISTED VAGINAL HYSTERECTOMY;  Surgeon: Hal MoralesVanessa P  Haygood, MD;  Location: [facility];  Service: Gynecology;  Laterality: N/A;   Family History  Problem Relation Age of Onset  . Diabetes Maternal Grandfather   . Cancer Maternal Grandfather   . Diabetes Maternal Aunt   . Hypertension Father   . Hyperlipidemia Father   . Hypertension Paternal Grandmother   . Hypertension Maternal Grandmother   . Asthma Brother    History  Substance Use Topics  . Smoking status: Never Smoker   . Smokeless tobacco: Never Used  . Alcohol Use: Yes     Comment: social   OB History   Grav Para Term Preterm Abortions TAB SAB Ect Mult Living   2 2        2      Obstetric Comments   Cesarean section 2 yrs ago, and tubal ligation, with postop surgical complication(cyclic severe abd pain) with wound revieison  [facility],revieison of cicatrix with no pathologic findings     Review of Systems  Constitutional: Negative for fever.  HENT: Negative for congestion.   Eyes: Negative for visual disturbance.  Respiratory: Negative for shortness of breath.   Cardiovascular: Negative for chest pain.  Gastrointestinal: Positive for nausea, vomiting, abdominal pain and diarrhea.  Genitourinary: Negative for dysuria, vaginal bleeding and vaginal discharge.  Musculoskeletal: Negative for back pain.  Skin: Negative for rash.  Neurological: Negative for headaches.  Hematological: Does not bruise/bleed easily.  Psychiatric/Behavioral: Negative for confusion.      Allergies  Review of patient's allergies indicates no known allergies.  Home Medications   Prior to Admission medications   Medication Sig Start Date End Date Taking? Authorizing Provider  ibuprofen (ADVIL,MOTRIN) 600 MG tablet 1   po  pc every 6 hours for 5 days then as needed for pain 05/08/14   Henreitta LeberElmira Powell, PA-C  ondansetron (ZOFRAN) 4 MG tablet Take 1 tablet (4 mg total) by mouth every 8 (eight) hours as needed for nausea or vomiting. 05/08/14   Henreitta LeberElmira Powell, PA-C   oxyCODONE-acetaminophen (PERCOCET/ROXICET) 5-325 MG per tablet Take 1-2 tablets by mouth every 4 (four) hours as needed for severe pain (moderate to severe pain (when tolerating fluids)). 05/08/14   Henreitta LeberElmira Powell, PA-C  valACYclovir (VALTREX) 500 MG tablet Take 1 tablet (500 mg total) by mouth daily. 05/12/14   Hal MoralesVanessa P Haygood, MD   BP 133/86  Pulse 59  Temp(Src) 97.5 F (36.4 C) (Oral)  Resp 18  Ht 5\' 3"  (1.6 m)  Wt 150 lb (68.04 kg)  BMI 26.58 kg/m2  SpO2 100%  LMP 04/04/2013 Physical Exam  Nursing note and vitals reviewed. Constitutional: She is oriented to person, place, and time. She appears well-developed and well-nourished. No distress.  HENT:  Head: Normocephalic and atraumatic.  Mucous membranes are dry.  Eyes: Conjunctivae and EOM are normal. Pupils are equal, round, and reactive to light.  Neck: Normal range of motion.  Cardiovascular: Normal rate, regular rhythm and normal heart sounds.   No murmur heard. Pulmonary/Chest: Effort normal and breath sounds normal. No respiratory distress.  Abdominal: Soft. Bowel sounds are normal. She exhibits no distension and no mass. There is no tenderness.  Musculoskeletal: Normal range of motion.  Neurological: She is alert and oriented to person, place, and time. No cranial nerve deficit. She exhibits normal muscle tone. Coordination normal.  Skin: Skin is warm. No rash noted.    ED Course  Procedures (including critical care time) Labs Review Labs Reviewed  COMPREHENSIVE METABOLIC PANEL - Abnormal; Notable for the following:    CO2 18 (*)    Glucose, Bld 137 (*)    Total Protein 9.3 (*)    Alkaline Phosphatase 118 (*)    Anion gap 21 (*)    All other components within normal limits  CBC WITH DIFFERENTIAL  URINALYSIS, ROUTINE W REFLEX MICROSCOPIC  PREGNANCY, URINE  LIPASE, BLOOD   Results for orders placed during the hospital encounter of 07/31/14  COMPREHENSIVE METABOLIC PANEL      Result Value Ref Range   Sodium 141   137 - 147 mEq/L   Potassium 4.1  3.7 - 5.3 mEq/L   Chloride 102  96 - 112 mEq/L   CO2 18 (*) 19 - 32 mEq/L   Glucose, Bld 137 (*) 70 - 99 mg/dL   BUN 8  6 - 23 mg/dL   Creatinine, Ser 1.610.60  0.50 - 1.10 mg/dL   Calcium 09.610.1  8.4 - 04.510.5 mg/dL   Total Protein 9.3 (*) 6.0 - 8.3 g/dL   Albumin 4.5  3.5 - 5.2 g/dL   AST 28  0 - 37 U/L   ALT 8  0 - 35 U/L   Alkaline Phosphatase 118 (*) 39 - 117 U/L   Total Bilirubin 0.3  0.3 - 1.2 mg/dL   GFR calc non Af Amer >90  >90 mL/min   GFR calc Af Amer >90  >90 mL/min   Anion gap 21 (*) 5 - 15  CBC WITH DIFFERENTIAL  Result Value Ref Range   WBC 7.0  4.0 - 10.5 K/uL   RBC 5.09  3.87 - 5.11 MIL/uL   Hemoglobin 14.7  12.0 - 15.0 g/dL   HCT 96.0  45.4 - 09.8 %   MCV 83.7  78.0 - 100.0 fL   MCH 28.9  26.0 - 34.0 pg   MCHC 34.5  30.0 - 36.0 g/dL   RDW 11.9  14.7 - 82.9 %   Platelets 346  150 - 400 K/uL   Neutrophils Relative % 70  43 - 77 %   Neutro Abs 4.9  1.7 - 7.7 K/uL   Lymphocytes Relative 21  12 - 46 %   Lymphs Abs 1.5  0.7 - 4.0 K/uL   Monocytes Relative 9  3 - 12 %   Monocytes Absolute 0.6  0.1 - 1.0 K/uL   Eosinophils Relative 0  0 - 5 %   Eosinophils Absolute 0.0  0.0 - 0.7 K/uL   Basophils Relative 0  0 - 1 %   Basophils Absolute 0.0  0.0 - 0.1 K/uL     Imaging Review No results found.   EKG Interpretation None      MDM   Final diagnoses:  Abdominal pain    Patient with acute onset of lower quadrant abdominal pain at the same time with the vomiting and now some loose bowel movements. Symptoms started at 6:30 this morning has vomited at least 10 times. Patient denies any vaginal discharge. Patient's had a hysterectomy. CT scan is pending to evaluate the abdomen. The nausea and vomiting being treated with IV fluids 1 L of normal saline no leukocytosis no anemia. No significant electrolyte abnormalities other than a metabolic acidosis which could be related to dehydration. Potassium is normal creatinine is normal BUN is  normal. Patient's vital signs here are normal no tachycardia no fever no hypotension.   Patient symptoms seem to be consistent with acute onset of a gastroenteritis. There is a significant amount of clinical dehydration mucous membranes very dry. Labs without significant abnormalities other than a metabolic acidosis. No evidence of a pre-septic her septic picture at this point in time.  Disposition will be based on the CT scan findings. Patient will need anti-emetics and pain medication for discharge.      Vanetta Mulders, MD 07/31/14 1524

## 2014-07-31 NOTE — ED Notes (Signed)
Patient states she woke up this morning at 0700 with abdominal cramps, nausea, vomiting and diarrhea.

## 2014-07-31 NOTE — ED Provider Notes (Signed)
Accepted care from Dr. Deretha EmoryZackowski. Awaiting CT of abdomen.  CT of abdomen is noncontributory. I discussed the findings of the CT with her. She continues to appear well and is drinking ginger ale. Likely a viral gastroenteritis. Will have her return for any worsening.  Clinical Impression 1. Abdominal pain   2. Gastroenteritis      Purvis SheffieldForrest Nekisha Mcdiarmid, MD 07/31/14 1705

## 2014-07-31 NOTE — ED Notes (Signed)
Pt is unable to urinate

## 2014-07-31 NOTE — ED Notes (Signed)
Waiting for pregnancy test results before patient starts drinking oral contrast for CT

## 2014-08-10 ENCOUNTER — Encounter (HOSPITAL_BASED_OUTPATIENT_CLINIC_OR_DEPARTMENT_OTHER): Payer: Self-pay | Admitting: Emergency Medicine

## 2014-10-07 ENCOUNTER — Telehealth: Payer: Self-pay | Admitting: *Deleted

## 2014-10-07 NOTE — Telephone Encounter (Signed)
Spoke with pt about getting flu shot, she stated she already had it this year and will call to give us the dates to update our information. Lamonte SakaiZimmerman Rumple, April D

## 2015-04-23 ENCOUNTER — Emergency Department (HOSPITAL_COMMUNITY)
Admission: EM | Admit: 2015-04-23 | Discharge: 2015-04-23 | Disposition: A | Discharge status: Home / Self Care | Payer: 59 | Attending: Emergency Medicine | Admitting: Emergency Medicine

## 2015-04-23 DIAGNOSIS — Z9851 Tubal ligation status: Secondary | ICD-10-CM | POA: Diagnosis not present

## 2015-04-23 DIAGNOSIS — Z8619 Personal history of other infectious and parasitic diseases: Secondary | ICD-10-CM | POA: Diagnosis not present

## 2015-04-23 DIAGNOSIS — Z872 Personal history of diseases of the skin and subcutaneous tissue: Secondary | ICD-10-CM | POA: Insufficient documentation

## 2015-04-23 DIAGNOSIS — R197 Diarrhea, unspecified: Secondary | ICD-10-CM

## 2015-04-23 DIAGNOSIS — Z3202 Encounter for pregnancy test, result negative: Secondary | ICD-10-CM | POA: Diagnosis not present

## 2015-04-23 DIAGNOSIS — Z79899 Other long term (current) drug therapy: Secondary | ICD-10-CM | POA: Diagnosis not present

## 2015-04-23 DIAGNOSIS — Z8742 Personal history of other diseases of the female genital tract: Secondary | ICD-10-CM | POA: Insufficient documentation

## 2015-04-23 DIAGNOSIS — R112 Nausea with vomiting, unspecified: Secondary | ICD-10-CM

## 2015-04-23 DIAGNOSIS — R1013 Epigastric pain: Secondary | ICD-10-CM

## 2015-04-23 DIAGNOSIS — K529 Noninfective gastroenteritis and colitis, unspecified: Secondary | ICD-10-CM | POA: Insufficient documentation

## 2015-04-23 DIAGNOSIS — R109 Unspecified abdominal pain: Secondary | ICD-10-CM | POA: Diagnosis present

## 2015-04-23 LAB — COMPREHENSIVE METABOLIC PANEL
ALK PHOS: 90 U/L (ref 38–126)
ALT: 11 U/L — AB (ref 14–54)
AST: 24 U/L (ref 15–41)
Albumin: 4.2 g/dL (ref 3.5–5.0)
Anion gap: 12 (ref 5–15)
BILIRUBIN TOTAL: 0.3 mg/dL (ref 0.3–1.2)
BUN: 7 mg/dL (ref 6–20)
CALCIUM: 9.7 mg/dL (ref 8.9–10.3)
CO2: 20 mmol/L — ABNORMAL LOW (ref 22–32)
Chloride: 107 mmol/L (ref 101–111)
Creatinine, Ser: 0.71 mg/dL (ref 0.44–1.00)
GFR calc non Af Amer: >60 mL/min (ref >60)
Glucose, Bld: 121 mg/dL — ABNORMAL HIGH (ref 65–99)
POTASSIUM: 3.9 mmol/L (ref 3.5–5.1)
SODIUM: 139 mmol/L (ref 135–145)
Total Protein: 8 g/dL (ref 6.5–8.1)

## 2015-04-23 LAB — URINE MICROSCOPIC-ADD ON

## 2015-04-23 LAB — URINALYSIS, ROUTINE W REFLEX MICROSCOPIC
Bilirubin Urine: NEGATIVE
GLUCOSE, UA: NEGATIVE mg/dL
HGB URINE DIPSTICK: NEGATIVE
KETONES UR: NEGATIVE mg/dL
Leukocytes, UA: NEGATIVE
NITRITE: NEGATIVE
Protein, ur: 30 mg/dL — AB
Specific Gravity, Urine: 1.025 (ref 1.005–1.030)
Urobilinogen, UA: 0.2 mg/dL (ref 0.0–1.0)
pH: 8 (ref 5.0–8.0)

## 2015-04-23 LAB — CBC
HCT: 38.6 % (ref 36.0–46.0)
HEMOGLOBIN: 13.5 g/dL (ref 12.0–15.0)
MCH: 28.9 pg (ref 26.0–34.0)
MCHC: 35 g/dL (ref 30.0–36.0)
MCV: 82.7 fL (ref 78.0–100.0)
Platelets: 324 10*3/uL (ref 150–400)
RBC: 4.67 MIL/uL (ref 3.87–5.11)
RDW: 12.6 % (ref 11.5–15.5)
WBC: 8.1 10*3/uL (ref 4.0–10.5)

## 2015-04-23 LAB — LIPASE, BLOOD: Lipase: 13 U/L — ABNORMAL LOW (ref 22–51)

## 2015-04-23 LAB — I-STAT BETA HCG BLOOD, ED (MC, WL, AP ONLY)

## 2015-04-23 MED ORDER — DICYCLOMINE HCL 10 MG/ML IM SOLN
20.0000 mg | Freq: Once | INTRAMUSCULAR | Status: AC
  Administered 2015-04-23: 20 mg via INTRAMUSCULAR
  Filled 2015-04-23: qty 2

## 2015-04-23 MED ORDER — ONDANSETRON HCL 4 MG PO TABS: 4.0000 mg | ORAL_TABLET | Freq: Three times a day (TID) | ORAL | Status: DC | PRN

## 2015-04-23 MED ORDER — SODIUM CHLORIDE 0.9 % IV BOLUS (SEPSIS)
1000.0000 mL | Freq: Once | INTRAVENOUS | Status: AC
  Administered 2015-04-23: 1000 mL via INTRAVENOUS

## 2015-04-23 MED ORDER — ONDANSETRON HCL 4 MG/2ML IJ SOLN
4.0000 mg | Freq: Once | INTRAMUSCULAR | Status: AC
  Administered 2015-04-23: 4 mg via INTRAVENOUS
  Filled 2015-04-23: qty 2

## 2015-04-23 MED ORDER — METOCLOPRAMIDE HCL 5 MG/ML IJ SOLN
10.0000 mg | Freq: Once | INTRAMUSCULAR | Status: AC
  Administered 2015-04-23: 10 mg via INTRAVENOUS
  Filled 2015-04-23: qty 2

## 2015-04-23 NOTE — ED Notes (Signed)
C/o generalized abd pain, nausea, and vomiting since 8am.  Reports diarrhea this morning.

## 2015-04-23 NOTE — ED Notes (Signed)
MD at bedside. 

## 2015-04-23 NOTE — ED Provider Notes (Signed)
CSN: [id number]     Arrival date & time 04/23/15  1942 History   First MD Initiated Contact with Patient 04/23/15 2008     Chief Complaint  Patient presents with  . Abdominal Pain   (Consider location/radiation/quality/duration/timing/severity/associated sxs/prior Treatment) Patient is a 39 y.o. female presenting with vomiting. The history is provided by the patient. No language interpreter was used.  Emesis Severity:  Moderate Timing:  Constant Quality:  Stomach contents Progression:  Unchanged Chronicity:  New Recent urination:  Normal Relieved by:  Nothing Worsened by:  Nothing tried Ineffective treatments:  Antiemetics Associated symptoms: abdominal pain, chills and diarrhea   Associated symptoms: no arthralgias, no cough, no fever, no headaches, no myalgias, no sore throat and no URI   Risk factors: no alcohol use, no diabetes, not pregnant now, no sick contacts, no suspect food intake and no travel to endemic areas     Past Medical History  Diagnosis Date  . Enlarged uterus   . Seroma   . Dysmenorrhea   . BV (bacterial vaginosis)   . HSV-2 infection   . Chlamydia   . Cellulitis 2011    c/s scar revision  . No pertinent past medical history    Past Surgical History  Procedure Laterality Date  . Cesarean section  2001, 2010  . Cesarean section w/btl  2001,2010    Dr Pennie Rushing  . Revision of abdominal scar  2011    Dr Pennie Rushing at [facility], for cyclic abd pain  . Laparoscopy  07/26/2012    Procedure: LAPAROSCOPY OPERATIVE;  Surgeon: Hal Morales, MD;  Location: [facility];  Service: Gynecology;  Laterality: N/A;  Open Laparoscopy 90 minutes  . Tubal ligation    . Right knee surgery      acl reconstruction  . Laparoscopic assisted vaginal hysterectomy N/A 05/07/2014    Procedure: LAPAROSCOPIC ASSISTED VAGINAL HYSTERECTOMY;  Surgeon: Hal Morales, MD;  Location: [facility];  Service: Gynecology;  Laterality: N/A;   Family History  Problem Relation Age of Onset  .  Diabetes Maternal Grandfather   . Cancer Maternal Grandfather   . Diabetes Maternal Aunt   . Hypertension Father   . Hyperlipidemia Father   . Hypertension Paternal Grandmother   . Hypertension Maternal Grandmother   . Asthma Brother    History  Substance Use Topics  . Smoking status: Never Smoker   . Smokeless tobacco: Never Used  . Alcohol Use: Yes     Comment: social   OB History    Gravida Para Term Preterm AB TAB SAB Ectopic Multiple Living   2 2        2       Obstetric Comments   Cesarean section 2 yrs ago, and tubal ligation, with postop surgical complication(cyclic severe abd pain) with wound revieison  requiring repeat surgery at [facility] of c[facility]atrix with no pathologic findings     Review of Systems  Constitutional: Positive for chills. Negative for diaphoresis and fatigue.  HENT: Negative for sore throat.   Respiratory: Negative for cough, chest tightness and shortness of breath.   Cardiovascular: Negative for chest pain.  Gastrointestinal: Positive for vomiting, abdominal pain and diarrhea. Negative for nausea, constipation and blood in stool.  Musculoskeletal: Negative for myalgias and arthralgias.  Neurological: Negative for weakness, light-headedness and headaches.  Psychiatric/Behavioral: Negative for confusion.  All other systems reviewed and are negative.   Allergies  Review of patient's allergies indicates no known allergies.  Home Medications   Prior to Admission  medications   Medication Sig Start Date End Date Taking? Authorizing Provider  HYDROcodone-acetaminophen (NORCO/VICODIN) 5-325 MG per tablet Take 1-2 tablets by mouth every 6 (six) hours as needed for moderate pain. 07/31/14   Vanetta MuldersScott Zackowski, MD  ibuprofen (ADVIL,MOTRIN) 600 MG tablet 1   po  pc every 6 hours for 5 days then as needed for pain 05/08/14   Henreitta LeberElmira Powell, PA-C  ondansetron (ZOFRAN) 4 MG tablet Take 1 tablet (4 mg total) by mouth every 8 (eight) hours as needed for  nausea or vomiting. 05/08/14   Henreitta LeberElmira Powell, PA-C  oxyCODONE-acetaminophen (PERCOCET/ROXICET) 5-325 MG per tablet Take 1-2 tablets by mouth every 4 (four) hours as needed for severe pain (moderate to severe pain (when tolerating fluids)). 05/08/14   Henreitta LeberElmira Powell, PA-C  promethazine (PHENERGAN) 25 MG tablet Take 1 tablet (25 mg total) by mouth every 6 (six) hours as needed for nausea or vomiting. 07/31/14   Vanetta MuldersScott Zackowski, MD  valACYclovir (VALTREX) 500 MG tablet Take 1 tablet (500 mg total) by mouth daily. 05/12/14   Hal MoralesVanessa P Haygood, MD   BP 117/76 mmHg  Pulse 73  Temp(Src) 98.1 F (36.7 C) (Oral)  Resp 18  Ht 5\' 3"  (1.6 m)  Wt 139 lb (63.05 kg)  BMI 24.63 kg/m2  SpO2 99%  LMP 04/04/2013   Physical Exam  Constitutional: She is oriented to person, place, and time. She appears well-developed and well-nourished. No distress.  Well appearing   HENT:  Head: Normocephalic and atraumatic.  Nose: Nose normal.  Mouth/Throat: Oropharynx is clear and moist. No oropharyngeal exudate.  Eyes: EOM are normal. Pupils are equal, round, and reactive to light.  Neck: Normal range of motion. Neck supple.  Cardiovascular: Normal rate, regular rhythm, normal heart sounds and intact distal pulses.   No murmur heard. Pulmonary/Chest: Effort normal and breath sounds normal. No respiratory distress. She has no wheezes. She exhibits no tenderness.  Abdominal: Soft. There is tenderness (mild epigastric tenderness to palpation). There is no rebound and no guarding.  Soft, no guarding, no rebound, able to change positions easily   Musculoskeletal: Normal range of motion. She exhibits no tenderness.  Lymphadenopathy:    She has no cervical adenopathy.  Neurological: She is alert and oriented to person, place, and time. No cranial nerve deficit. Coordination normal.  Skin: Skin is warm and dry. She is not diaphoretic.  Psychiatric: She has a normal mood and affect. Her behavior is normal. Judgment and thought  content normal.  Nursing note and vitals reviewed.   ED Course  Procedures (including critical care time) Labs Review Labs Reviewed  LIPASE, BLOOD - Abnormal; Notable for the following:    Lipase 13 (*)    All other components within normal limits  COMPREHENSIVE METABOLIC PANEL - Abnormal; Notable for the following:    CO2 20 (*)    Glucose, Bld 121 (*)    ALT 11 (*)    All other components within normal limits  URINALYSIS, ROUTINE W REFLEX MICROSCOPIC (NOT AT [facility]) - Abnormal; Notable for the following:    Protein, ur 30 (*)    All other components within normal limits  URINE MICROSCOPIC-ADD ON - Abnormal; Notable for the following:    Squamous Epithelial / LPF FEW (*)    Bacteria, UA FEW (*)    All other components within normal limits  CBC  I-STAT BETA HCG BLOOD, ED ([facility], [facility], [facility] ONLY)   Imaging Review No results found.   EKG Interpretation None  MDM   Final diagnoses:  Gastroenteritis  Epigastric pain  Nausea vomiting and diarrhea    Pt is a 39 yo F with no significant PMH who presents with nausea, vomiting, and diarrhea for 1 day.  Complains of mild diffuse abdominal cramping and nausea, then developed epigastric gnawing pain after she started vomiting.  Has had several loose stools and NBNB emesis.  Took phenergan at home with no improvement.  Still having normal urinary output.  No fevers.  Is s/p hysterectomy but no other abdominal surgeries.  No recent travel, no known sick contacts, no suspicious food intake.  Well appearing, in NAD.  Abdomen is soft, no guarding.  Epigastric tenderness to palpation but no murphy's sign and overall benign exam.   Will treat symptomatically and evaluate with labs.   Given NS bolus and zofran.  Still somewhat nauseated and with cramping abdominal pain, so given reglan.  Labs returned reassuring.  Not dehydrated, normal lactate.  UA with few epithelial cells and few bacteria, unlikely UTI.   Still complaining of mild  abdominal discomfort, so provided with bentyl.  Considered stable for discharge home.  Likely viral gastritis.  Doubt acute emergent abdominal pathology based on benign exam, labs, and vitals.   Given Rx for zofran and advised on close PCP follow up if sx continue.  All questions were answered and ED return precautions were discussed.    If performed, labs, EKGs, and imaging were reviewed and interpreted by myself and my attending, and incorporated in the medical decision making.  Patient was seen with ED Attending, Dr. Vale Haven, MD      Lenell Antu, MD 04/24/15 1610  Rolan Bucco, MD 04/24/15 1103

## 2015-04-30 ENCOUNTER — Ambulatory Visit: Payer: 59 | Admitting: Family Medicine

## 2015-05-03 ENCOUNTER — Ambulatory Visit: Payer: 59 | Admitting: Family Medicine

## 2016-11-09 ENCOUNTER — Ambulatory Visit (INDEPENDENT_AMBULATORY_CARE_PROVIDER_SITE_OTHER): Payer: 59 | Admitting: Family Medicine

## 2016-11-09 ENCOUNTER — Encounter: Payer: Self-pay | Admitting: Family Medicine

## 2016-11-09 VITALS — BP 110/70 | HR 115 | Temp 97.8°F | Wt 136.0 lb

## 2016-11-09 DIAGNOSIS — M25511 Pain in right shoulder: Secondary | ICD-10-CM

## 2016-11-09 MED ORDER — NAPROXEN 500 MG PO TABS: 500.0000 mg | ORAL_TABLET | Freq: Two times a day (BID) | ORAL | 2 refills | Status: DC

## 2016-11-09 NOTE — Patient Instructions (Signed)
You were seen in clinic today to be evaluated for right sided shoulder pain.  I have ordered an MRI and your nurse will schedule that for you.  Until then you can take Naproxen twice daily as needed for the pain.  In addition, continue using heating pads and rest the arm as much as possible.  I will follow up with the results of the MRI.    Freddrick MarchYashika Aveah Castell, MD
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
## 2016-11-13 ENCOUNTER — Ambulatory Visit (HOSPITAL_COMMUNITY)
Admission: RE | Admit: 2016-11-13 | Discharge: 2016-11-13 | Disposition: A | Discharge status: Home / Self Care | Payer: 59 | Source: Ambulatory Visit | Attending: Family Medicine | Admitting: Family Medicine

## 2016-11-13 DIAGNOSIS — M25511 Pain in right shoulder: Secondary | ICD-10-CM

## 2016-11-13 DIAGNOSIS — M25411 Effusion, right shoulder: Secondary | ICD-10-CM | POA: Insufficient documentation

## 2016-11-13 DIAGNOSIS — M7591 Shoulder lesion, unspecified, right shoulder: Secondary | ICD-10-CM | POA: Insufficient documentation

## 2016-11-13 NOTE — Assessment & Plan Note (Addendum)
Likely rotator cuff injury given findings on physical exam, limited ROM and history.   No warmth, erythema or swelling over the joint indicating an infection or other acute process.   -Will obtain MRI and follow up.  -Advised patient to rest the arm and take Naproxen in the meantime. Prescription given for 500 mg BID.  - MRI appointment scheduled for Monday.   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  -Recommended ROM exercises ie wall crawl, pendulum exercises -Pending results of MRI can consider PT referral  -Return precautions discussed -Follow up in 1 week

## 2016-11-24 ENCOUNTER — Encounter: Payer: 59 | Admitting: Family Medicine

## 2016-12-15 ENCOUNTER — Telehealth: Payer: Self-pay | Admitting: Family Medicine

## 2016-12-15 NOTE — Telephone Encounter (Signed)
Will forward to ordering to MD.

## 2016-12-15 NOTE — Telephone Encounter (Signed)
Pt called because she had a MRI done a couple of weeks ago and would like to know what the results are.Please call / jw

## 2016-12-18 ENCOUNTER — Telehealth: Payer: Self-pay | Admitting: Family Medicine

## 2016-12-18 NOTE — Telephone Encounter (Signed)
Patient came into office would like call back in regards to MRI on shoulder done in Feb.  Best # [phone]

## 2016-12-19 NOTE — Telephone Encounter (Signed)
Called patient. Discussed MRI results. Discussed my agreement with Dr. Shanda BumpsAmin's recommendations. Encouraged Naproxen BID PRN and regular icing. I also informed patient that if it doesn't get better over the next couple weeks I'd be happy to inject shoulder.  Also, in future will consider: home PT with RT strengthening, and possible scapular stabilization exercises depending on posture and any findings of scapular winging.   Thank you Dr. Nelson ChimesAmin for seeing patient.

## 2017-05-22 ENCOUNTER — Encounter: Payer: Self-pay | Admitting: Internal Medicine

## 2017-05-22 ENCOUNTER — Ambulatory Visit (INDEPENDENT_AMBULATORY_CARE_PROVIDER_SITE_OTHER): Payer: Medicaid Other | Admitting: Internal Medicine

## 2017-05-22 VITALS — BP 118/84 | HR 99 | Temp 98.1°F | Wt 135.0 lb

## 2017-05-22 DIAGNOSIS — M25511 Pain in right shoulder: Secondary | ICD-10-CM

## 2017-05-22 MED ORDER — METHYLPREDNISOLONE ACETATE 40 MG/ML IJ SUSP
40.0000 mg | Freq: Once | INTRAMUSCULAR | Status: AC
  Administered 2017-05-22: 40 mg via INTRAMUSCULAR

## 2017-05-22 NOTE — Patient Instructions (Signed)
Ms. Judy Baird,  I do think this is a flareup of your tendinosis.  Please continue naproxen as needed. I will refer you to Physical Therapy for 1 session on exercises. Expect a call within a week to schedule.  Come back if any redness or increased pain.  Best, Dr. Sampson GoonFitzgerald

## 2017-05-22 NOTE — Progress Notes (Signed)
[facility] Progress Note  Subjective:  Judy Baird is a 41 y.o. female with history of menorrhagia s/p hysterectomy who presents today for R shoulder pain. This is an ongoing issue. She was evaluated for this 11/2016 and had MRI performed showing supraspinatus tendinosis. Pain had been present for about 5 months then improved over the last couple of months until last week when pain returned. Patient denies any injury. She experience a brief "popping" sensation in shoulder, but pain preceding that sensation. She works at an office. Pain makes it difficult to sleep. Says pain is similar to previous. Pain now radiates down arm but is primarily over R outer shoulder. She is aware of it most of the time, though it is not limiting her mobility. She has been taking naprosyn with some relief.   ROS: No falls, no numbness  Social: Never smoker  No Known Allergies  Objective: Blood pressure 118/84, pulse 99, temperature 98.1 F (36.7 C), temperature source Oral, weight 135 lb (61.2 kg), last menstrual period 04/04/2013, SpO2 99 %. Body mass index is 23.91 kg/m. Constitutional: Well-appearing female in NAD.  Musculoskeletal: TTP over R outer shoulder without any deformity. ROM intact. Strength 5-/5 with resisted shoulder ab/duction on R compared to L. Pain with lift-off test on R. FROM with Apley Scratch test.  Neurological: AOx3, no focal deficits. Skin: Skin is warm and dry. No rash noted. No erythema.  Psychiatric: Normal mood and affect.  Vitals reviewed  Assessment/Plan: INJECTION: Patient was given informed consent, signed copy in the chart. Appropriate time out was taken. Area prepped and draped in usual sterile fashion. 1 cc of methylprednisolone 40 mg/ml plus 4 cc of 1% lidocaine without epinephrine was injected into the R shoulder using a posterior approach. The patient tolerated the procedure well. There were no complications. Post procedure instructions were given.  Right  shoulder pain - Suspect flare up supraspinatus tendinosis. FROM and only minimally decreased strength with shoulder ab/duction, so do not think patient has tear.  - Recommended taking naprosyn twice daily for next 2 weeks and/or shoulder injection. Patient preferred to proceed with shoulder injection. Detailed above. - Provided handout on exercises to maintain shoulder mobility. - Placed referral to PT for patient to have introductory session on exercises to try to prevent worsening of injury.   Follow-up prn.  Dani GobbleHillary Fitzgerald, MD [facility], PGY-3

## 2017-05-23 NOTE — Assessment & Plan Note (Signed)
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 FROM and only minimally decreased strength with shoulder ab/duction, so do not think patient has tear.  - Recommended taking naprosyn twice daily for next 2 weeks and/or shoulder injection. Patient preferred to proceed with shoulder injection. Detailed above. - Provided handout on exercises to maintain shoulder mobility. - Placed referral to PT for patient to have introductory session on exercises to try to prevent worsening of injury.

## 2017-06-19 NOTE — Progress Notes (Addendum)
Clarification: Procedure performed was right shoulder subacromial bursa injection.  Dani GobbleHillary Elizjah Noblet, MD [facility], PGY-3

## 2017-12-06 ENCOUNTER — Encounter (HOSPITAL_COMMUNITY): Payer: Self-pay | Admitting: Family Medicine

## 2017-12-06 ENCOUNTER — Ambulatory Visit (HOSPITAL_COMMUNITY)
Admission: EM | Admit: 2017-12-06 | Discharge: 2017-12-06 | Disposition: A | Discharge status: Home / Self Care | Payer: Self-pay | Attending: Emergency Medicine | Admitting: Emergency Medicine

## 2017-12-06 DIAGNOSIS — M545 Low back pain, unspecified: Secondary | ICD-10-CM

## 2017-12-06 DIAGNOSIS — S161XXA Strain of muscle, fascia and tendon at neck level, initial encounter: Secondary | ICD-10-CM

## 2017-12-06 MED ORDER — KETOROLAC TROMETHAMINE 60 MG/2ML IM SOLN
60.0000 mg | Freq: Once | INTRAMUSCULAR | Status: AC
  Administered 2017-12-06: 60 mg via INTRAMUSCULAR

## 2017-12-06 MED ORDER — KETOROLAC TROMETHAMINE 60 MG/2ML IM SOLN
INTRAMUSCULAR | Status: AC
  Filled 2017-12-06: qty 2

## 2017-12-06 MED ORDER — CYCLOBENZAPRINE HCL 10 MG PO TABS: 10.0000 mg | ORAL_TABLET | Freq: Two times a day (BID) | ORAL | 0 refills | Status: DC | PRN

## 2017-12-06 NOTE — Discharge Instructions (Signed)
Use anti-inflammatories for pain/swelling. You may take up to 800 mg Ibuprofen every 8 hours with food. You may supplement Ibuprofen with Tylenol [phone] mg every 8 hours.   You may also use Flexeril with anti-inflammatories.  Please use this every 12 hours as needed.  Please apply ice and heat to help with any discomfort and swelling.  Please return if you are not having any improvement in 1-2 weeks, return sooner if having significant worsening of symptoms, weakness, numbness, tingling.
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
## 2017-12-25 ENCOUNTER — Ambulatory Visit (INDEPENDENT_AMBULATORY_CARE_PROVIDER_SITE_OTHER): Payer: Managed Care, Other (non HMO) | Admitting: Family Medicine

## 2017-12-25 ENCOUNTER — Encounter: Payer: Self-pay | Admitting: Family Medicine

## 2017-12-25 ENCOUNTER — Other Ambulatory Visit: Payer: Self-pay

## 2017-12-25 VITALS — BP 110/80 | HR 79 | Temp 98.0°F | Wt 138.0 lb

## 2017-12-25 DIAGNOSIS — M545 Low back pain, unspecified: Secondary | ICD-10-CM

## 2017-12-25 MED ORDER — LIDOCAINE 5 % EX OINT: 1.0000 "application " | TOPICAL_OINTMENT | Freq: Four times a day (QID) | CUTANEOUS | 0 refills | Status: DC | PRN
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
## 2017-12-26 ENCOUNTER — Encounter: Payer: Self-pay | Admitting: Family Medicine

## 2017-12-26 DIAGNOSIS — M545 Low back pain, unspecified: Secondary | ICD-10-CM | POA: Insufficient documentation
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
## 2018-04-16 IMAGING — MR MR SHOULDER*R* W/O CM
4 of 5 series · 19 of 40 positions shown · non-contrast
Comparison: None.

CLINICAL DATA: Right shoulder pain running into a arm and hand.
Numbness and tingling.

EXAM:
MRI OF THE RIGHT SHOULDER WITHOUT CONTRAST
TECHNIQUE: Multiplanar, multisequence MR imaging of the shoulder was performed.
No intravenous contrast was administered.

[Series 2: T2 fat-sat · axial · 4.0mm · 0.39mm/px · z∈[-9,+79]mm · 6 of 23 slices shown (1 of 3)]
[im 1/23]
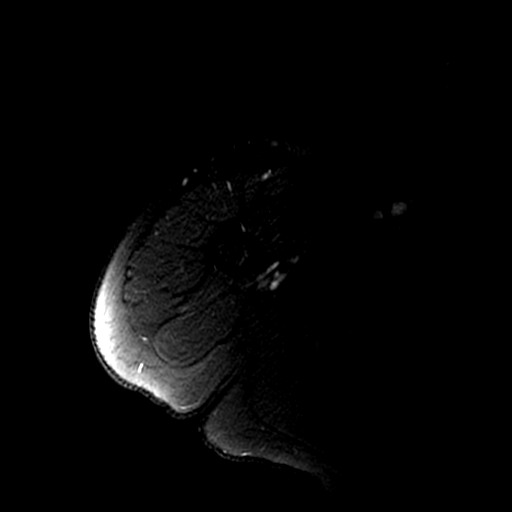
[im 4/23]
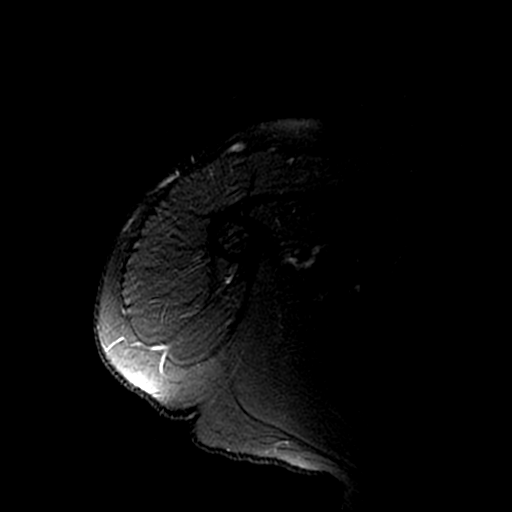
[im 7/23]
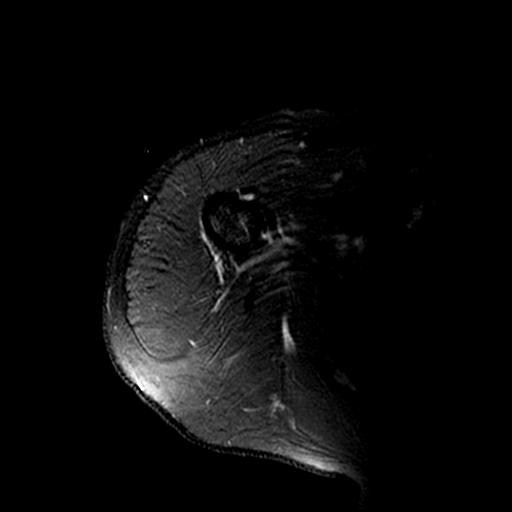
[im 10/23]
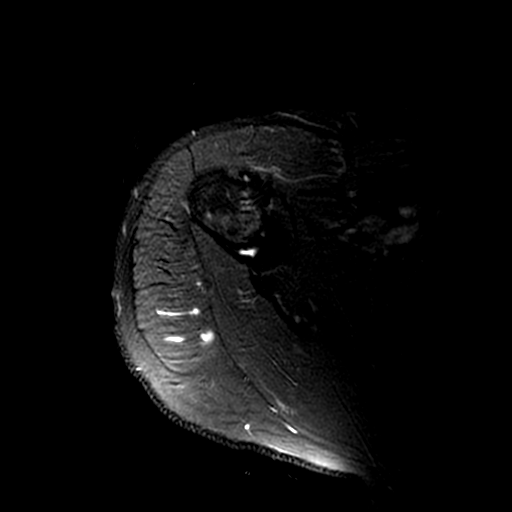
[im 13/23]
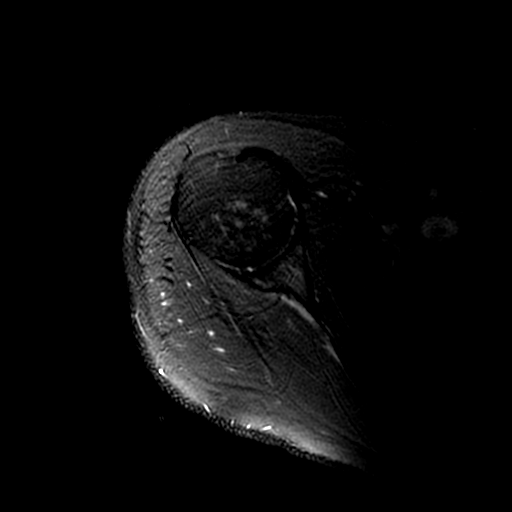
[im 19/23]
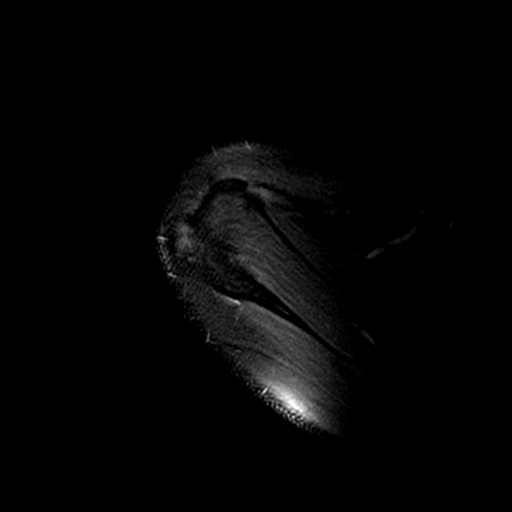

[Series 4: T2 fat-sat · sagittal · 4.0mm · 0.29mm/px · 3 of 18 slices shown (2 of 3)]
[im 3/18]
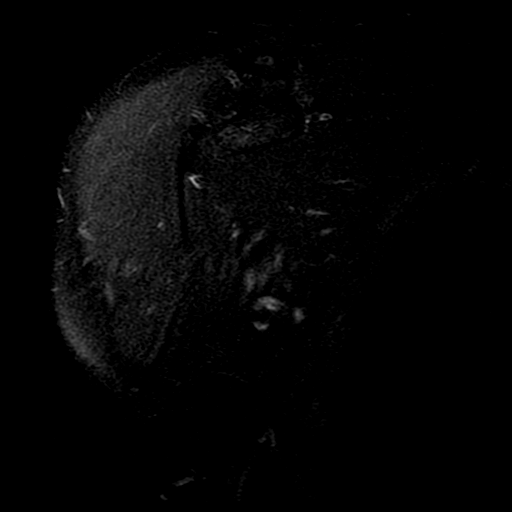
[im 9/18]
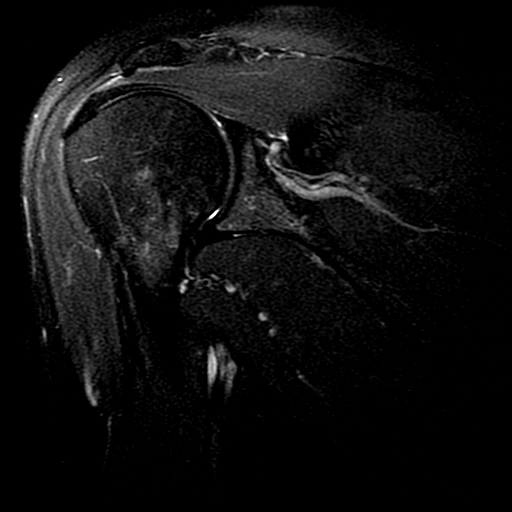
[im 15/18]
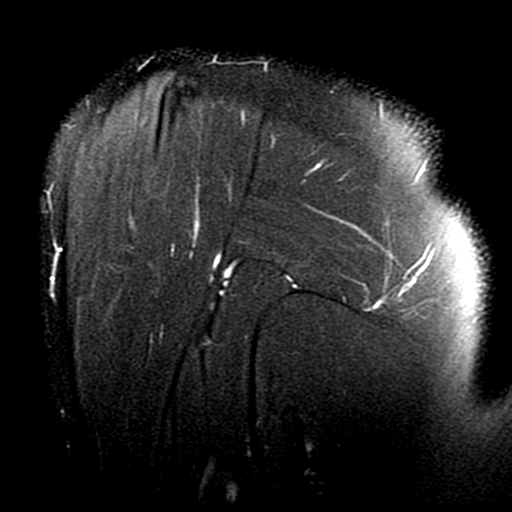

[Series 5: PD · sagittal · 4.0mm · 0.29mm/px · 7 of 18 slices shown]
[im 1/18]
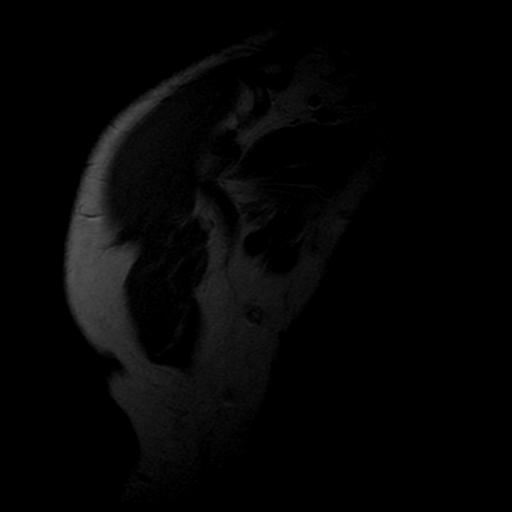
[im 3/18]
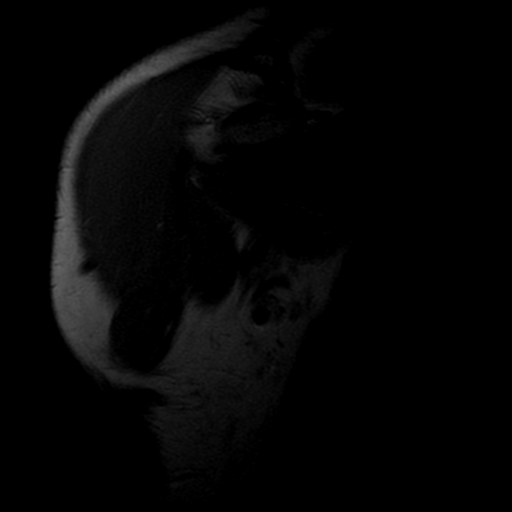
[im 6/18]
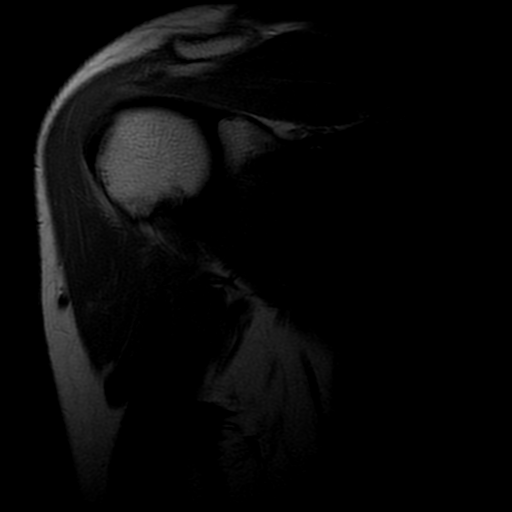
[im 9/18]
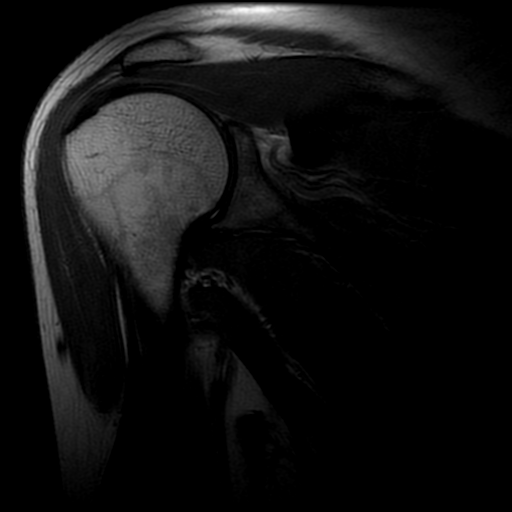
[im 12/18]
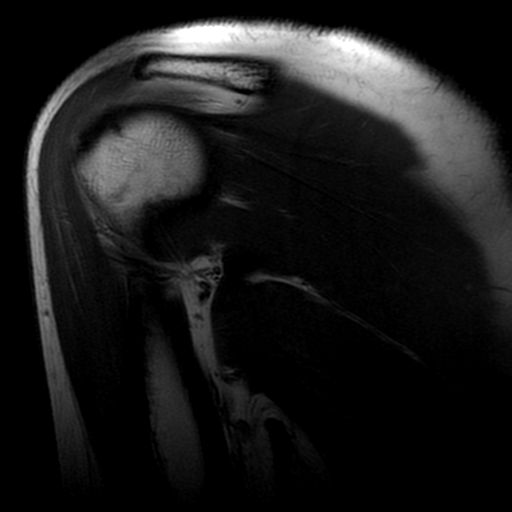
[im 15/18]
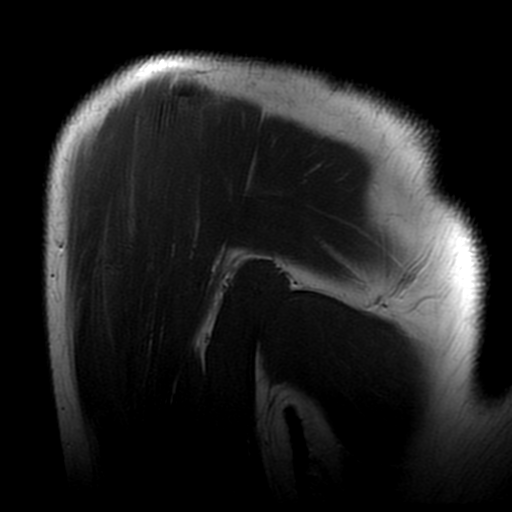
[im 18/18]
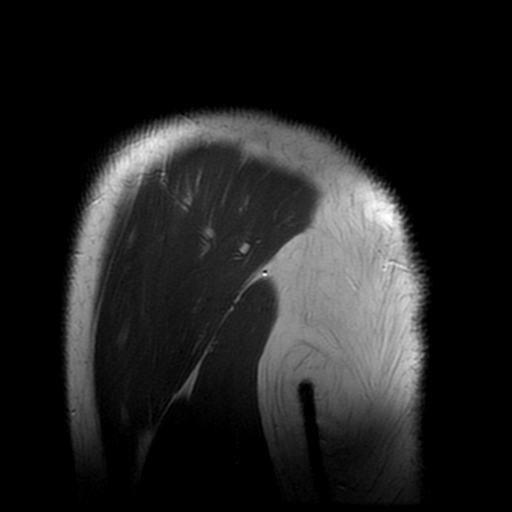

[Series 7: T2 fat-sat · oblique · 4.0mm · 0.29mm/px · 3 of 24 slices shown (3 of 3)]
[im 3/24]
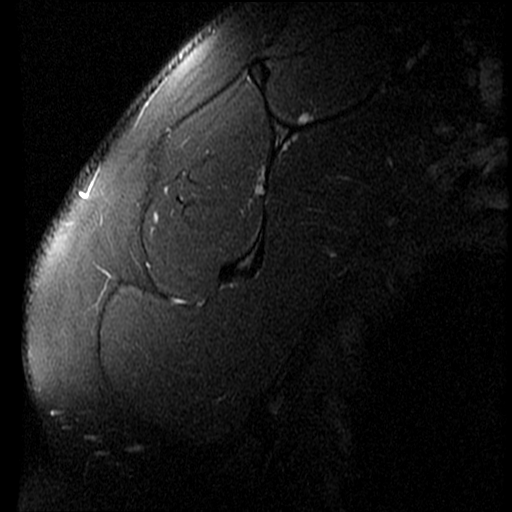
[im 12/24]
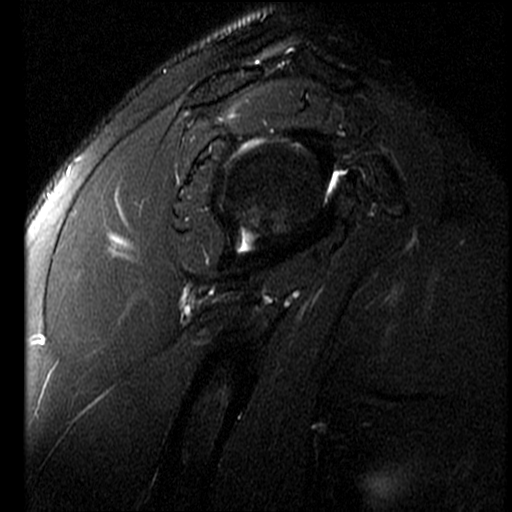
[im 21/24]
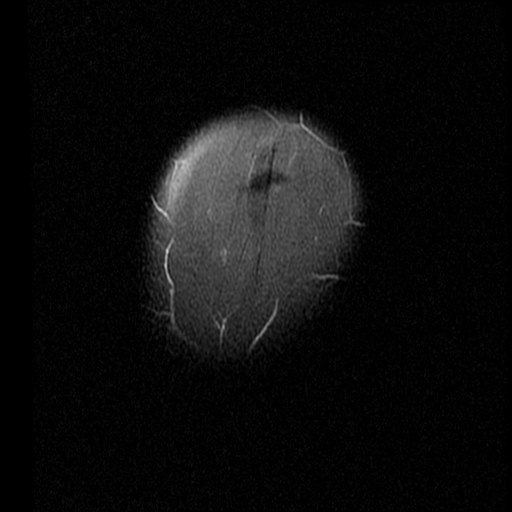

[19 of 40 positions shown; findings below may reference images not displayed]

FINDINGS: Rotator cuff: Mild thickening of the distal supraspinatus tendon
consistent with tendinosis. No rotator cuff tear.

Muscles: Mild interstitial edema of the posterior fibers of the
supraspinous muscle consistent muscle strain. No muscle atrophy or
retraction.

Biceps long head:  Intact.

Acromioclavicular Joint: Mild arthropathy of the acromioclavicular
joint. Type II acromion. No subacromial/subdeltoid bursal fluid.

Glenohumeral Joint: No joint effusion. No chondral defect.

Labrum:  Intact.

Bones:  No marrow abnormality, fracture or dislocation.

Other: None.
IMPRESSION: 1. Mild thickening of the distal supraspinatus tendon consistent
with supraspinatus tendinosis.
2. Mild interstitial edema of the posterior fibers of the
supraspinatus muscle consistent with mild muscle strain.

## 2018-04-23 DIAGNOSIS — M542 Cervicalgia: Secondary | ICD-10-CM | POA: Diagnosis not present

## 2018-04-23 DIAGNOSIS — M25511 Pain in right shoulder: Secondary | ICD-10-CM | POA: Diagnosis not present

## 2018-05-02 DIAGNOSIS — M25511 Pain in right shoulder: Secondary | ICD-10-CM | POA: Diagnosis not present

## 2018-05-07 DIAGNOSIS — M25511 Pain in right shoulder: Secondary | ICD-10-CM | POA: Diagnosis not present

## 2018-06-05 ENCOUNTER — Encounter (HOSPITAL_BASED_OUTPATIENT_CLINIC_OR_DEPARTMENT_OTHER): Payer: Self-pay | Admitting: *Deleted

## 2018-06-05 ENCOUNTER — Other Ambulatory Visit: Payer: Self-pay

## 2018-06-13 ENCOUNTER — Ambulatory Visit (HOSPITAL_BASED_OUTPATIENT_CLINIC_OR_DEPARTMENT_OTHER): Payer: Managed Care, Other (non HMO) | Admitting: Anesthesiology

## 2018-06-13 ENCOUNTER — Ambulatory Visit (HOSPITAL_BASED_OUTPATIENT_CLINIC_OR_DEPARTMENT_OTHER)
Admission: RE | Admit: 2018-06-13 | Discharge: 2018-06-13 | Disposition: A | Discharge status: Home / Self Care | Payer: Managed Care, Other (non HMO) | Source: Ambulatory Visit | Attending: Orthopaedic Surgery | Admitting: Orthopaedic Surgery

## 2018-06-13 ENCOUNTER — Encounter (HOSPITAL_BASED_OUTPATIENT_CLINIC_OR_DEPARTMENT_OTHER)
Admission: RE | Disposition: A | Discharge status: Home / Self Care | Payer: Self-pay | Source: Ambulatory Visit | Attending: Orthopaedic Surgery

## 2018-06-13 ENCOUNTER — Other Ambulatory Visit: Payer: Self-pay

## 2018-06-13 ENCOUNTER — Encounter (HOSPITAL_BASED_OUTPATIENT_CLINIC_OR_DEPARTMENT_OTHER): Payer: Self-pay | Admitting: Anesthesiology

## 2018-06-13 DIAGNOSIS — M7551 Bursitis of right shoulder: Secondary | ICD-10-CM | POA: Diagnosis not present

## 2018-06-13 DIAGNOSIS — M948X1 Other specified disorders of cartilage, shoulder: Secondary | ICD-10-CM | POA: Diagnosis not present

## 2018-06-13 DIAGNOSIS — M24111 Other articular cartilage disorders, right shoulder: Secondary | ICD-10-CM | POA: Diagnosis not present

## 2018-06-13 DIAGNOSIS — M7521 Bicipital tendinitis, right shoulder: Secondary | ICD-10-CM | POA: Diagnosis not present

## 2018-06-13 DIAGNOSIS — M7541 Impingement syndrome of right shoulder: Secondary | ICD-10-CM | POA: Insufficient documentation

## 2018-06-13 DIAGNOSIS — G8918 Other acute postprocedural pain: Secondary | ICD-10-CM | POA: Diagnosis not present

## 2018-06-13 DIAGNOSIS — Z791 Long term (current) use of non-steroidal anti-inflammatories (NSAID): Secondary | ICD-10-CM | POA: Insufficient documentation

## 2018-06-13 SURGERY — SHOULDER ARTHROSCOPY WITH SUBACROMIAL DECOMPRESSION AND DISTAL CLAVICLE EXCISION
Anesthesia: General | Site: Shoulder | Laterality: Right

## 2018-06-13 MED ORDER — DEXAMETHASONE SODIUM PHOSPHATE 10 MG/ML IJ SOLN
INTRAMUSCULAR | Status: DC | PRN
  Administered 2018-06-13: 10 mg via INTRAVENOUS

## 2018-06-13 MED ORDER — DEXAMETHASONE SODIUM PHOSPHATE 10 MG/ML IJ SOLN
INTRAMUSCULAR | Status: AC
  Filled 2018-06-13: qty 1

## 2018-06-13 MED ORDER — FENTANYL CITRATE (PF) 100 MCG/2ML IJ SOLN
INTRAMUSCULAR | Status: DC | PRN
  Administered 2018-06-13: 50 ug via INTRAVENOUS

## 2018-06-13 MED ORDER — PHENYLEPHRINE 40 MCG/ML (10ML) SYRINGE FOR IV PUSH (FOR BLOOD PRESSURE SUPPORT)
PREFILLED_SYRINGE | INTRAVENOUS | Status: AC
  Filled 2018-06-13: qty 10

## 2018-06-13 MED ORDER — CEFAZOLIN SODIUM-DEXTROSE 2-4 GM/100ML-% IV SOLN
INTRAVENOUS | Status: AC
  Filled 2018-06-13: qty 100

## 2018-06-13 MED ORDER — ACETAMINOPHEN 500 MG PO TABS: 1000.0000 mg | ORAL_TABLET | Freq: Three times a day (TID) | ORAL | 0 refills | Status: AC

## 2018-06-13 MED ORDER — PROPOFOL 10 MG/ML IV BOLUS
INTRAVENOUS | Status: DC | PRN
  Administered 2018-06-13: 110 mg via INTRAVENOUS

## 2018-06-13 MED ORDER — LACTATED RINGERS IV SOLN
INTRAVENOUS | Status: DC
  Administered 2018-06-13 (×2): via INTRAVENOUS

## 2018-06-13 MED ORDER — OXYCODONE HCL 5 MG PO TABS: ORAL_TABLET | ORAL | 0 refills | Status: AC

## 2018-06-13 MED ORDER — SUGAMMADEX SODIUM 200 MG/2ML IV SOLN
INTRAVENOUS | Status: DC | PRN
  Administered 2018-06-13: 140 mg via INTRAVENOUS

## 2018-06-13 MED ORDER — LIDOCAINE HCL (CARDIAC) PF 100 MG/5ML IV SOSY
PREFILLED_SYRINGE | INTRAVENOUS | Status: DC | PRN
  Administered 2018-06-13: 20 mg via INTRAVENOUS

## 2018-06-13 MED ORDER — LIDOCAINE 2% (20 MG/ML) 5 ML SYRINGE
INTRAMUSCULAR | Status: AC
  Filled 2018-06-13: qty 5

## 2018-06-13 MED ORDER — PROPOFOL 500 MG/50ML IV EMUL
INTRAVENOUS | Status: AC
  Filled 2018-06-13: qty 50

## 2018-06-13 MED ORDER — CEFAZOLIN SODIUM-DEXTROSE 2-4 GM/100ML-% IV SOLN
2.0000 g | INTRAVENOUS | Status: AC
  Administered 2018-06-13: 2 g via INTRAVENOUS

## 2018-06-13 MED ORDER — SCOPOLAMINE 1 MG/3DAYS TD PT72: 1.0000 | MEDICATED_PATCH | Freq: Once | TRANSDERMAL | Status: DC | PRN

## 2018-06-13 MED ORDER — FENTANYL CITRATE (PF) 100 MCG/2ML IJ SOLN
INTRAMUSCULAR | Status: AC
  Filled 2018-06-13: qty 2

## 2018-06-13 MED ORDER — BUPIVACAINE HCL (PF) 0.25 % IJ SOLN
INTRAMUSCULAR | Status: AC
  Filled 2018-06-13: qty 30

## 2018-06-13 MED ORDER — ONDANSETRON HCL 4 MG PO TABS: 4.0000 mg | ORAL_TABLET | Freq: Three times a day (TID) | ORAL | 1 refills | Status: AC | PRN

## 2018-06-13 MED ORDER — ROCURONIUM BROMIDE 100 MG/10ML IV SOLN
INTRAVENOUS | Status: DC | PRN
  Administered 2018-06-13: 40 mg via INTRAVENOUS

## 2018-06-13 MED ORDER — PHENYLEPHRINE 40 MCG/ML (10ML) SYRINGE FOR IV PUSH (FOR BLOOD PRESSURE SUPPORT)
PREFILLED_SYRINGE | INTRAVENOUS | Status: DC | PRN
  Administered 2018-06-13 (×3): 80 ug via INTRAVENOUS
  Administered 2018-06-13: 100 ug via INTRAVENOUS

## 2018-06-13 MED ORDER — ONDANSETRON HCL 4 MG/2ML IJ SOLN
INTRAMUSCULAR | Status: DC | PRN
  Administered 2018-06-13: 4 mg via INTRAVENOUS

## 2018-06-13 MED ORDER — EPHEDRINE SULFATE-NACL 50-0.9 MG/10ML-% IV SOSY
PREFILLED_SYRINGE | INTRAVENOUS | Status: DC | PRN
  Administered 2018-06-13 (×2): 5 mg via INTRAVENOUS

## 2018-06-13 MED ORDER — BUPIVACAINE-EPINEPHRINE (PF) 0.5% -1:200000 IJ SOLN
INTRAMUSCULAR | Status: DC | PRN
  Administered 2018-06-13: 25 mL via PERINEURAL

## 2018-06-13 MED ORDER — CHLORHEXIDINE GLUCONATE 4 % EX LIQD: 60.0000 mL | Freq: Once | CUTANEOUS | Status: DC

## 2018-06-13 MED ORDER — ONDANSETRON HCL 4 MG/2ML IJ SOLN
INTRAMUSCULAR | Status: AC
  Filled 2018-06-13: qty 2

## 2018-06-13 MED ORDER — FENTANYL CITRATE (PF) 100 MCG/2ML IJ SOLN
50.0000 ug | INTRAMUSCULAR | Status: DC | PRN
  Administered 2018-06-13: 100 ug via INTRAVENOUS

## 2018-06-13 MED ORDER — ROCURONIUM BROMIDE 50 MG/5ML IV SOSY
PREFILLED_SYRINGE | INTRAVENOUS | Status: AC
  Filled 2018-06-13: qty 5

## 2018-06-13 MED ORDER — EPHEDRINE 5 MG/ML INJ
INTRAVENOUS | Status: AC
  Filled 2018-06-13: qty 10

## 2018-06-13 MED ORDER — MIDAZOLAM HCL 2 MG/2ML IJ SOLN
1.0000 mg | INTRAMUSCULAR | Status: DC | PRN
  Administered 2018-06-13: 2 mg via INTRAVENOUS

## 2018-06-13 MED ORDER — OMEPRAZOLE 20 MG PO CPDR: 20.0000 mg | DELAYED_RELEASE_CAPSULE | Freq: Every day | ORAL | 0 refills | Status: DC

## 2018-06-13 MED ORDER — EPINEPHRINE 30 MG/30ML IJ SOLN
INTRAMUSCULAR | Status: AC
  Filled 2018-06-13: qty 1

## 2018-06-13 MED ORDER — MIDAZOLAM HCL 2 MG/2ML IJ SOLN
INTRAMUSCULAR | Status: AC
  Filled 2018-06-13: qty 2

## 2018-06-13 MED ORDER — NAPROXEN 500 MG PO TABS: 250.0000 mg | ORAL_TABLET | Freq: Two times a day (BID) | ORAL | 1 refills | Status: AC

## 2018-06-13 MED ORDER — HYDROMORPHONE HCL 1 MG/ML IJ SOLN: 0.2500 mg | INTRAMUSCULAR | Status: DC | PRN

## 2018-06-13 SURGICAL SUPPLY — 74 items
BENZOIN TINCTURE PRP APPL 2/3 (GAUZE/BANDAGES/DRESSINGS) ×4 IMPLANT
BLADE EXCALIBUR 4.0MM X 13CM (MISCELLANEOUS)
BLADE EXCALIBUR 4.0X13 (MISCELLANEOUS) IMPLANT
BLADE HEX COATED 2.75 (ELECTRODE) IMPLANT
BLADE SHAVER BONE 5.0MM X 13CM (MISCELLANEOUS)
BLADE SHAVER BONE 5.0X13 (MISCELLANEOUS) IMPLANT
BLADE SURG 10 STRL SS (BLADE) IMPLANT
BNDG COHESIVE 4X5 TAN STRL (GAUZE/BANDAGES/DRESSINGS) IMPLANT
BURR OVAL 8 FLU 4.0MM X 13CM (MISCELLANEOUS)
BURR OVAL 8 FLU 4.0X13 (MISCELLANEOUS) IMPLANT
CANNULA 5.75X71 LONG (CANNULA) IMPLANT
CANNULA PASSPORT BUTTON 10-40 (CANNULA) IMPLANT
CANNULA TWIST IN 8.25X7CM (CANNULA) IMPLANT
CHLORAPREP W/TINT 26ML (MISCELLANEOUS) ×4 IMPLANT
CLOSURE WOUND 1/2 X4 (GAUZE/BANDAGES/DRESSINGS) ×1
DECANTER SPIKE VIAL GLASS SM (MISCELLANEOUS) IMPLANT
DISSECTOR 3.5MM X 13CM CVD (MISCELLANEOUS) IMPLANT
DISSECTOR 4.0MMX13CM CVD (MISCELLANEOUS) IMPLANT
DRAPE IMP U-DRAPE 54X76 (DRAPES) ×4 IMPLANT
DRAPE INCISE IOBAN 66X45 STRL (DRAPES) IMPLANT
DRAPE SHOULDER BEACH CHAIR (DRAPES) ×4 IMPLANT
DRSG PAD ABDOMINAL 8X10 ST (GAUZE/BANDAGES/DRESSINGS) ×4 IMPLANT
ELECT NEEDLE TIP 2.8 STRL (NEEDLE) IMPLANT
ELECT REM PT RETURN 9FT ADLT (ELECTROSURGICAL)
ELECTRODE REM PT RTRN 9FT ADLT (ELECTROSURGICAL) IMPLANT
GAUZE SPONGE 4X4 12PLY STRL (GAUZE/BANDAGES/DRESSINGS) ×4 IMPLANT
GLOVE BIOGEL PI IND STRL 7.0 (GLOVE) ×4 IMPLANT
GLOVE BIOGEL PI IND STRL 8 (GLOVE) ×2 IMPLANT
GLOVE BIOGEL PI INDICATOR 7.0 (GLOVE) ×4
GLOVE BIOGEL PI INDICATOR 8 (GLOVE) ×2
GLOVE ECLIPSE 6.5 STRL STRAW (GLOVE) ×4 IMPLANT
GLOVE ECLIPSE 8.0 STRL XLNG CF (GLOVE) ×4 IMPLANT
GOWN STRL REUS W/ TWL LRG LVL3 (GOWN DISPOSABLE) ×2 IMPLANT
GOWN STRL REUS W/TWL LRG LVL3 (GOWN DISPOSABLE) ×2
GOWN STRL REUS W/TWL XL LVL3 (GOWN DISPOSABLE) ×4 IMPLANT
IV NS IRRIG 3000ML ARTHROMATIC (IV SOLUTION) ×12 IMPLANT
KIT STABILIZATION SHOULDER (MISCELLANEOUS) ×4 IMPLANT
LASSO CRESCENT QUICKPASS (SUTURE) IMPLANT
MANIFOLD NEPTUNE II (INSTRUMENTS) ×4 IMPLANT
NDL SAFETY ECLIPSE 18X1.5 (NEEDLE) ×2 IMPLANT
NDL SUT 6 .5 CRC .975X.05 MAYO (NEEDLE) IMPLANT
NEEDLE HYPO 18GX1.5 SHARP (NEEDLE) ×2
NEEDLE MAYO TAPER (NEEDLE)
NEEDLE SCORPION MULTI FIRE (NEEDLE) IMPLANT
PACK ARTHROSCOPY DSU (CUSTOM PROCEDURE TRAY) ×4 IMPLANT
PACK BASIN DAY SURGERY FS (CUSTOM PROCEDURE TRAY) ×4 IMPLANT
PENCIL BUTTON HOLSTER BLD 10FT (ELECTRODE) IMPLANT
PROBE BIPOLAR ATHRO 135MM 90D (MISCELLANEOUS) IMPLANT
RESTRAINT HEAD UNIVERSAL NS (MISCELLANEOUS) ×4 IMPLANT
SHEET MEDIUM DRAPE 40X70 STRL (DRAPES) ×4 IMPLANT
SLEEVE SCD COMPRESS KNEE MED (MISCELLANEOUS) ×4 IMPLANT
SLING ARM FOAM STRAP LRG (SOFTGOODS) ×4 IMPLANT
SLING ARM IMMOBILIZER LRG (SOFTGOODS) IMPLANT
SLING ARM IMMOBILIZER MED (SOFTGOODS) IMPLANT
SLING ARM MED ADULT FOAM STRAP (SOFTGOODS) IMPLANT
SLING ARM XL FOAM STRAP (SOFTGOODS) IMPLANT
SPONGE LAP 4X18 RFD (DISPOSABLE) IMPLANT
STRIP CLOSURE SKIN 1/2X4 (GAUZE/BANDAGES/DRESSINGS) ×3 IMPLANT
SUT FIBERWIRE #2 38 T-5 BLUE (SUTURE)
SUT MNCRL AB 4-0 PS2 18 (SUTURE) ×4 IMPLANT
SUT PDS AB 1 CT  36 (SUTURE)
SUT PDS AB 1 CT 36 (SUTURE) IMPLANT
SUT TIGER TAPE 7 IN WHITE (SUTURE) IMPLANT
SUTURE FIBERWR #2 38 T-5 BLUE (SUTURE) IMPLANT
SUTURE TAPE TIGERLINK 1.3MM BL (SUTURE) IMPLANT
SUTURETAPE TIGERLINK 1.3MM BL (SUTURE)
SYR 5ML LUER SLIP (SYRINGE) ×4 IMPLANT
TAPE FIBER 2MM 7IN #2 BLUE (SUTURE) IMPLANT
TOWEL GREEN STERILE FF (TOWEL DISPOSABLE) ×4 IMPLANT
TOWEL OR NON WOVEN STRL DISP B (DISPOSABLE) ×4 IMPLANT
TUBE CONNECTING 20'X1/4 (TUBING) ×1
TUBE CONNECTING 20X1/4 (TUBING) ×3 IMPLANT
TUBE SUCTION HIGH CAP CLEAR NV (SUCTIONS) IMPLANT
TUBING ARTHROSCOPY IRRIG 16FT (MISCELLANEOUS) IMPLANT

## 2018-06-13 NOTE — Transfer of Care (Signed)
Immediate Anesthesia Transfer of Care Note  Patient: LANISSA NEUFFER  Procedure(s) Performed: SHOULDER ARTHROSCOPY WITH SUBACROMIAL DECOMPRESSION AND DISTAL CLAVICLE EXCISION (Right Shoulder)  Patient Location: PACU  Anesthesia Type:GA combined with regional for post-op pain  Level of Consciousness: drowsy and patient cooperative  Airway & Oxygen Therapy: Patient Spontanous Breathing and Patient connected to face mask oxygen  Post-op Assessment: Report given to RN and Post -op Vital signs reviewed and stable  Post vital signs: Reviewed and stable  Last Vitals:  Vitals Value Taken Time  BP 110/60 06/13/2018  9:20 AM  Temp    Pulse 82 06/13/2018  9:24 AM  Resp 23 06/13/2018  9:24 AM  SpO2 100 % 06/13/2018  9:24 AM  Vitals shown include unvalidated device data.  Last Pain:  Vitals:   06/13/18 0731  TempSrc: Oral  PainSc: 4       Patients Stated Pain Goal: 3 (06/13/18 0731)  Complications: No apparent anesthesia complications

## 2018-06-13 NOTE — Anesthesia Postprocedure Evaluation (Signed)
Anesthesia Post Note  Patient: Judy Baird  Procedure(s) Performed: SHOULDER ARTHROSCOPY WITH SUBACROMIAL DECOMPRESSION AND DISTAL CLAVICLE EXCISION (Right Shoulder)     Patient location during evaluation: PACU Anesthesia Type: General and Regional Level of consciousness: awake and alert Pain management: pain level controlled Vital Signs Assessment: post-procedure vital signs reviewed and stable Respiratory status: spontaneous breathing, nonlabored ventilation and respiratory function stable Cardiovascular status: blood pressure returned to baseline and stable Postop Assessment: no apparent nausea or vomiting Anesthetic complications: no    Last Vitals:  Vitals:   06/13/18 1000 06/13/18 1029  BP: 103/69 109/79  Pulse: 89 81  Resp: (!) 21 20  Temp:  (!) 36.4 C  SpO2: 99% 100%    Last Pain:  Vitals:   06/13/18 1029  TempSrc:   PainSc: 0-No pain                 Brodi Kari,W. EDMOND

## 2018-06-13 NOTE — H&P (Signed)
PREOPERATIVE H&P  Chief Complaint: CARTILAGE DISORDER OF RIGHT SHOULDER ,IMPINGEMENT SYNDROME OF RIGHT SHOULDER,BICIPITAL TENDINITIS  HPI: Judy Baird is a 42 y.o. female who presents for preoperative history and physical with a diagnosis of CARTILAGE DISORDER OF RIGHT SHOULDER ,IMPINGEMENT SYNDROME OF RIGHT SHOULDER,BICIPITAL TENDINITIS. Symptoms are rated as moderate to severe, and have been worsening.  This is significantly impairing activities of daily living.  Please see my clinic note for full details on this patient's care.  She has elected for surgical management.   Past Medical History:  Diagnosis Date  . BV (bacterial vaginosis)   . Cellulitis 2011   c/s scar revision  . Chlamydia   . Dysmenorrhea   . Enlarged uterus   . HSV-2 infection   . No pertinent past medical history   . Seroma    Past Surgical History:  Procedure Laterality Date  . CESAREAN SECTION  2001, 2010  . CESAREAN SECTION W/BTL  [id number]   Dr Pennie Rushing  . LAPAROSCOPIC ASSISTED VAGINAL HYSTERECTOMY N/A 05/07/2014   Procedure: LAPAROSCOPIC ASSISTED VAGINAL HYSTERECTOMY;  Surgeon: Hal Morales, MD;  Location: [facility];  Service: Gynecology;  Laterality: N/A;  . LAPAROSCOPY  07/26/2012   Procedure: LAPAROSCOPY OPERATIVE;  Surgeon: Hal Morales, MD;  Location: [facility];  Service: Gynecology;  Laterality: N/A;  Open Laparoscopy 90 minutes  . REVISION OF ABDOMINAL SCAR  2011   Dr Pennie Rushing at [facility], for cyclic abd pain  . right knee surgery     acl reconstruction  . TUBAL LIGATION     Social History   Socioeconomic History  . Marital status: Married    Spouse name: Not on file  . Number of children: Not on file  . Years of education: Not on file  . Highest education level: Not on file  Occupational History  . Not on file  Social Needs  . Financial resource strain: Not on file  . Food insecurity:    Worry: Not on file    Inability: Not on file  . Transportation needs:    Medical: Not on file     Non-medical: Not on file  Tobacco Use  . Smoking status: Never Smoker  . Smokeless tobacco: Never Used  Substance and Sexual Activity  . Alcohol use: Yes    Comment: social  . Drug use: No  . Sexual activity: Yes    Birth control/protection: Surgical  Lifestyle  . Physical activity:    Days per week: Not on file    Minutes per session: Not on file  . Stress: Not on file  Relationships  . Social connections:    Talks on phone: Not on file    Gets together: Not on file    Attends religious service: Not on file    Active member of club or organization: Not on file    Attends meetings of clubs or organizations: Not on file    Relationship status: Not on file  Other Topics Concern  . Not on file  Social History Narrative  . Not on file   Family History  Problem Relation Age of Onset  . Diabetes Maternal Grandfather   . Cancer Maternal Grandfather   . Diabetes Maternal Aunt   . Hypertension Father   . Hyperlipidemia Father   . Hypertension Paternal Grandmother   . Hypertension Maternal Grandmother   . Asthma Brother    No Known Allergies Prior to Admission medications   Medication Sig Start Date End Date Taking? Authorizing Provider  cyclobenzaprine (FLEXERIL) 10 MG tablet Take 1 tablet (10 mg total) by mouth 2 (two) times daily as needed for muscle spasms. Patient taking differently: Take 10 mg by mouth as needed for muscle spasms.  12/06/17  Yes Wieters, Hallie C, PA-C  ibuprofen (ADVIL,MOTRIN) 600 MG tablet 1   po  pc every 6 hours for 5 days then as needed for pain 05/08/14  Yes Lowell Guitar, [place], PA-C  naproxen (NAPROSYN) 500 MG tablet Take 1 tablet (500 mg total) by mouth 2 (two) times daily with a meal. 11/09/16  Yes Freddrick March, MD     Positive ROS: All other systems have been reviewed and were otherwise negative with the exception of those mentioned in the HPI and as above.  Physical Exam: General: Alert, no acute distress Cardiovascular: No pedal  edema Respiratory: No cyanosis, no use of accessory musculature GI: No organomegaly, abdomen is soft and non-tender Skin: No lesions in the area of chief complaint Neurologic: Sensation intact distally Psychiatric: Patient is competent for consent with normal mood and affect Lymphatic: No axillary or cervical lymphadenopathy  MUSCULOSKELETAL: R shoulder + ac ttp, rom intact, + impingmenet  Assessment: CARTILAGE DISORDER OF RIGHT SHOULDER ,IMPINGEMENT SYNDROME OF RIGHT SHOULDER,BICIPITAL TENDINITIS  Plan: Plan for Procedure(s): SHOULDER ARTHROSCOPY WITH DEBRIDEMENT AND BICEP TENDON REPAIR SHOULDER ACROMIOPLASTY BICEPS TENODESIS  The risks benefits and alternatives were discussed with the patient including but not limited to the risks of nonoperative treatment, versus surgical intervention including infection, bleeding, nerve injury,  blood clots, cardiopulmonary complications, morbidity, mortality, among others, and they were willing to proceed.   Bjorn Pippin, MD  06/13/2018 8:02 AM

## 2018-06-13 NOTE — Progress Notes (Signed)
Assisted Dr. Edmond Fitzgerald with right, ultrasound guided, interscalene  block. Side rails up, monitors on throughout procedure. See vital signs in flow sheet. Tolerated Procedure well. 

## 2018-06-13 NOTE — Discharge Instructions (Signed)

## 2018-06-13 NOTE — Op Note (Signed)
Orthopaedic Surgery Operative Note (CSN: [id number])  Judy Baird  [birth year] Date of Surgery: 06/13/2018   Diagnoses:  CARTILAGE DISORDER OF RIGHT SHOULDER ,IMPINGEMENT SYNDROME OF RIGHT SHOULDER,BICIPITAL TENDINITIS  Procedure: Right shoulder arthroscopy with extensive debridement Distal clavicle excision Subacromial decompression   Operative Finding Successful completion of planned procedure.  Exam under anesthesia: Normal Articular space: No loose bodies, capsule intact, labrum intact Chondral surfaces:Intact, no sign of chondral degeneration on the glenoid or humeral head Biceps: Normal appearance and anchor Subscapularis: normal Superior Cuff:No significant wear or tearing, intact Bursal side: Significant bursitis, Type 2 acromion converted to Type 1, DCE performed.  Post-operative plan: The patient will be NWB in sling.  The patient will be dc home.  DVT prophylaxis not indicated in isolated upper extremity surgery patient with no specific risks factors.  Pain control with PRN pain medication preferring oral medicines.  Follow up plan will be scheduled in approximately 7 days for incision check and XR.  Post-Op Diagnosis: Same Surgeons:Primary: Bjorn Pippin, MD Assistants:None Location: [facility] OR ROOM 6 Anesthesia: Choice Antibiotics: Ancef 2g preop Tourniquet time: * No tourniquets in log * Estimated Blood Loss: minimal Complications: None Specimens: None Implants: * No implants in log *  Indications for Surgery:   Judy Baird is a 42 y.o. female with R shoulder pain refractory to non-op measures.  Benefits and risks of operative and nonoperative management were discussed prior to surgery with patient/guardian(s) and informed consent form was completed.  Specific risks including infection, need for additional surgery, continued pain, future rotator cuff pathology, stiffness and weakness.   Procedure:   Patient was correctly identified in the preoperative holding  area and operative site marked.  Patient brought to OR and positioned beachchair on an [place] table ensuring that all bony prominences were padded and the head was in an appropriate location.  Anesthesia was induced and the operative shoulder was prepped and draped in the usual sterile fashion.  Timeout was called preincision.  A standard posterior viewing portal was made after localizing the portal with a spinal needle.  An anterior accessory portal was also made.  After clearing the articular space the camera was positioned in the subacromial space.  Findings above.  Subacromial decompression: We made a lateral portal with spinal needle guidance. We then proceeded to debride bursal tissue extensively with a shaver and arthrocare device. At that point we continued to identify the borders of the acromion and identify the spur. We then carefully preserved the deltoid fascia and used a burr to convert the type 2 acromion to a Type 1 flat acromion without issue.  Distal Clavicle resection:  The scope was placed in the subacromial space from the posterior portal.  A hemostat was placed through the anterior portal and we spread at the [facility] joint.  A burr was then inserted and 10 mm of distal clavicle was resected taking care to avoid damage to the capsule around the joint and avoiding overhanging bone posteriorly.     The incisions were closed with absorbable monocryl, benzoin and steri strips.  A sterile dressing was placed along with a sling. The patient was awoken from general anesthesia and taken to the PACU in stable condition without complication.

## 2018-06-13 NOTE — Anesthesia Preprocedure Evaluation (Addendum)
Anesthesia Evaluation  Patient identified by MRN, date of birth, ID band Patient awake    Reviewed: Allergy & Precautions, H&P , NPO status , Patient's Chart, lab work & pertinent test results  Airway Mallampati: II  TM Distance: >3 FB Neck ROM: Full    Dental no notable dental hx. (+) Teeth Intact, Dental Advisory Given   Pulmonary neg pulmonary ROS,    Pulmonary exam normal breath sounds clear to auscultation       Cardiovascular negative cardio ROS   Rhythm:Regular Rate:Normal     Neuro/Psych negative neurological ROS  negative psych ROS   GI/Hepatic negative GI ROS, Neg liver ROS,   Endo/Other  negative endocrine ROS  Renal/GU negative Renal ROS  negative genitourinary   Musculoskeletal   Abdominal   Peds  Hematology negative hematology ROS (+)   Anesthesia Other Findings   Reproductive/Obstetrics negative OB ROS                            Anesthesia Physical Anesthesia Plan  ASA: I  Anesthesia Plan: General   Post-op Pain Management:  Regional for Post-op pain   Induction: Intravenous  PONV Risk Score and Plan: 3 and Ondansetron, Dexamethasone and Midazolam  Airway Management Planned: Oral ETT  Additional Equipment:   Intra-op Plan:   Post-operative Plan: Extubation in OR  Informed Consent: I have reviewed the patients History and Physical, chart, labs and discussed the procedure including the risks, benefits and alternatives for the proposed anesthesia with the patient or authorized representative who has indicated his/her understanding and acceptance.     Dental advisory given  Plan Discussed with: CRNA  Anesthesia Plan Comments:         Anesthesia Quick Evaluation  

## 2018-06-13 NOTE — Anesthesia Procedure Notes (Signed)
Anesthesia Regional Block: Interscalene brachial plexus block   Pre-Anesthetic Checklist: ,, timeout performed, Correct Patient, Correct Site, Correct Laterality, Correct Procedure, Correct Position, site marked, Risks and benefits discussed, pre-op evaluation,  At surgeon's request and post-op pain management  Laterality: Right  Prep: Maximum Sterile Barrier Precautions used, chloraprep       Needles:  Injection technique: Single-shot  Needle Type: Echogenic Stimulator Needle     Needle Length: 5cm  Needle Gauge: 22     Additional Needles:   Procedures:,,,, ultrasound used (permanent image in chart),,,,  Narrative:  Start time: 06/13/2018 7:43 AM End time: 06/13/2018 7:53 AM Injection made incrementally with aspirations every 5 mL. Anesthesiologist: Gaynelle Adu, MD  Additional Notes: 2% Lidocaine skin wheel.

## 2018-06-13 NOTE — Anesthesia Procedure Notes (Signed)
Procedure Name: Intubation Date/Time: 06/13/2018 8:12 AM Performed by: Yolonda Kida, CRNA Pre-anesthesia Checklist: Patient identified, Emergency Drugs available, Suction available and Patient being monitored Patient Re-evaluated:Patient Re-evaluated prior to induction Oxygen Delivery Method: Circle system utilized Preoxygenation: Pre-oxygenation with 100% oxygen Induction Type: IV induction Ventilation: Mask ventilation without difficulty Laryngoscope Size: Miller and 3 Grade View: Grade III Tube type: Oral Tube size: 7.0 mm Number of attempts: 1 Airway Equipment and Method: Stylet Placement Confirmation: positive ETCO2,  CO2 detector and breath sounds checked- equal and bilateral Secured at: 22 cm Tube secured with: Tape Dental Injury: Teeth and Oropharynx as per pre-operative assessment  Comments: Large hair. Difficult to extend head. Easy mask ventilation.

## 2018-06-14 NOTE — Addendum Note (Signed)
Addendum  created 06/14/18 0122 by Lance Coon, CRNA   Charge Capture section accepted

## 2018-06-21 DIAGNOSIS — M24111 Other articular cartilage disorders, right shoulder: Secondary | ICD-10-CM | POA: Diagnosis not present

## 2018-06-27 DIAGNOSIS — M6281 Muscle weakness (generalized): Secondary | ICD-10-CM | POA: Diagnosis not present

## 2018-06-27 DIAGNOSIS — M25511 Pain in right shoulder: Secondary | ICD-10-CM | POA: Diagnosis not present

## 2018-06-27 DIAGNOSIS — M7541 Impingement syndrome of right shoulder: Secondary | ICD-10-CM | POA: Diagnosis not present

## 2018-09-08 ENCOUNTER — Encounter (HOSPITAL_COMMUNITY): Payer: Self-pay | Admitting: Emergency Medicine

## 2018-09-08 ENCOUNTER — Ambulatory Visit (HOSPITAL_COMMUNITY)
Admission: EM | Admit: 2018-09-08 | Discharge: 2018-09-08 | Disposition: A | Discharge status: Home / Self Care | Payer: Managed Care, Other (non HMO) | Attending: Family Medicine | Admitting: Family Medicine

## 2018-09-08 ENCOUNTER — Other Ambulatory Visit: Payer: Self-pay

## 2018-09-08 DIAGNOSIS — H00034 Abscess of left upper eyelid: Secondary | ICD-10-CM

## 2018-09-08 DIAGNOSIS — H5712 Ocular pain, left eye: Secondary | ICD-10-CM

## 2018-09-08 DIAGNOSIS — L0201 Cutaneous abscess of face: Secondary | ICD-10-CM

## 2018-09-08 MED ORDER — AMOXICILLIN-POT CLAVULANATE 875-125 MG PO TABS: 1.0000 | ORAL_TABLET | Freq: Two times a day (BID) | ORAL | 0 refills | Status: DC

## 2018-09-08 NOTE — ED Provider Notes (Signed)
[facility]    CSN: [id number] Arrival date & time: 09/08/18  1019     History   Chief Complaint Chief Complaint  Patient presents with  . Eye Problem    HPI Judy Baird is a 42 y.o. female no contributing past medical history presenting today for evaluation of left upper lid swelling and pain.  States that she noticed a hard pimple or cyst above her left eye on Tuesday.  She applied an apple cider vinegar mask to her face to help with this.  The following morning on Wednesday she developed swelling she has regressed over the past 4 to 5 days.  She denies any difficulty sleeping.  Denies itching or irritation inside the eye.  Denies change in vision.  Denies eye pain.  HPI  Past Medical History:  Diagnosis Date  . BV (bacterial vaginosis)   . Cellulitis 2011   c/s scar revision  . Chlamydia   . Dysmenorrhea   . Enlarged uterus   . HSV-2 infection   . No pertinent past medical history   . Seroma     Patient Active Problem List   Diagnosis Date Noted  . Bilateral low back pain without sciatica 12/26/2017  . Right shoulder pain 11/13/2016  . 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 Menorrhagia 05/07/2014  . Fatigue 09/12/2013  . Insomnia 09/12/2013  . Enlarged uterus   . Dysmenorrhea   . HSV-2 infection     Past Surgical History:  Procedure Laterality Date  . CESAREAN SECTION  2001, 2010  . CESAREAN SECTION W/BTL  7829,5621   Dr Pennie Rushing  . LAPAROSCOPIC ASSISTED VAGINAL HYSTERECTOMY N/A 05/07/2014   Procedure: LAPAROSCOPIC ASSISTED VAGINAL HYSTERECTOMY;  Surgeon: Hal Morales, MD;  Location: [facility];  Service: Gynecology;  Laterality: N/A;  . LAPAROSCOPY  07/26/2012   Procedure: LAPAROSCOPY OPERATIVE;  Surgeon: Hal Morales, MD;  Location: [facility];  Service: Gynecology;  Laterality: N/A;  Open Laparoscopy 90 minutes  . REVISION OF ABDOMINAL SCAR  2011   Dr [facility]ie [facility] at [facility], for cyclic abd pain  . right knee surgery       acl reconstruction  . TUBAL LIGATION      OB History    Gravida  2   Para  2   Term      Preterm      AB      Living  2     SAB      TAB      Ectopic      Multiple      Live Births           Obstetric Comments  Cesarean section 2 yrs ago, and tubal ligation, with postop surgical complication(cyclic severe abd pain) with wound revieison  requiring repeat surgery at [facility] of cicatrix with no pathologic findings         Home Medications    Prior to Admission medications   Medication Sig Start Date End Date Taking? Authorizing Provider  amoxicillin-clavulanate (AUGMENTIN) 875-125 MG tablet Take 1 tablet by mouth every 12 (twelve) hours. 09/08/18   , Junius Creamer, PA-C    Family History Family History  Problem Relation Age of Onset  . Diabetes Maternal Grandfather   . Cancer Maternal Grandfather   . Diabetes Maternal Aunt   . Hypertension Father   . Hyperlipidemia Father   . Hypertension Paternal Grandmother   . Hypertension Maternal Grandmother   . Asthma Brother  Social History Social History   Tobacco Use  . Smoking status: Never Smoker  . Smokeless tobacco: Never Used  Substance Use Topics  . Alcohol use: Yes    Comment: social  . Drug use: No     Allergies   Patient has no known allergies.   Review of Systems Review of Systems  Constitutional: Negative for fatigue and fever.  HENT: Negative for mouth sores.   Eyes: Negative for photophobia, pain, discharge, redness and visual disturbance.  Respiratory: Negative for shortness of breath.   Cardiovascular: Negative for chest pain.  Gastrointestinal: Negative for abdominal pain, nausea and vomiting.  Musculoskeletal: Negative for arthralgias and joint swelling.  Skin: Positive for color change and wound. Negative for rash.  Neurological: Negative for dizziness, weakness, light-headedness and headaches.     Physical Exam Triage Vital Signs ED Triage Vitals   Enc Vitals Group     BP 09/08/18 1052 116/82     Pulse Rate 09/08/18 1052 97     Resp 09/08/18 1052 18     Temp 09/08/18 1052 98.1 F (36.7 C)     Temp Source 09/08/18 1052 Oral     SpO2 09/08/18 1052 96 %     Weight --      Height --      Head Circumference --      Peak Flow --      Pain Score 09/08/18 1050 7     Pain Loc --      Pain Edu? --      Excl. in GC? --    No data found.  Updated Vital Signs BP 116/82 (BP Location: Left Arm)   Pulse 97   Temp 98.1 F (36.7 C) (Oral)   Resp 18   LMP 04/04/2013   SpO2 96%   Visual Acuity Right Eye Distance: 20/20 Left Eye Distance: 20/20 Bilateral Distance: 20/20(patient currently is wearing contacts)  Right Eye Near:   Left Eye Near:    Bilateral Near:     Physical Exam  Constitutional: She is oriented to person, place, and time. She appears well-developed and well-nourished.  No acute distress  HENT:  Head: Normocephalic and atraumatic.  Nose: Nose normal.  Eyes: Pupils are equal, round, and reactive to light. Conjunctivae and EOM are normal.  Conjunctiva nonerythematous, no drainage present, left eyelid with erythema and swelling, large fluctuant mass to superior aspect of eyelid below left eyebrow, see picture below  Neck: Neck supple.  Cardiovascular: Normal rate.  Pulmonary/Chest: Effort normal. No respiratory distress.  Abdominal: She exhibits no distension.  Musculoskeletal: Normal range of motion.  Neurological: She is alert and oriented to person, place, and time.  Skin: Skin is warm and dry.  Psychiatric: She has a normal mood and affect.  Nursing note and vitals reviewed.      UC Treatments / Results  Labs (all labs ordered are listed, but only abnormal results are displayed) Labs Reviewed - No data to display  EKG None  Radiology No results found.  Procedures Incision and Drainage Date/Time: 09/08/2018 12:24 PM Performed by: , Junius Creamer, PA-C Authorized by: Elvina Sidle, MD    Consent:    Consent obtained:  Verbal   Consent given by:  Patient   Risks discussed:  Incomplete drainage, pain and infection   Alternatives discussed:  No treatment and alternative treatment Location:    Type:  Abscess   Size:  3 cm   Location:  Head   Head location:  L eyelid  Pre-procedure details:    Skin preparation:  Betadine Anesthesia (see MAR for exact dosages):    Anesthesia method:  Topical application and local infiltration   Topical anesthesia: Grueber's pain ease.   Local anesthetic:  Lidocaine 2% w/o epi Procedure type:    Complexity:  Simple Procedure details:    Needle aspiration: no     Incision types:  Stab incision   Incision depth:  Subcutaneous   Scalpel blade:  11   Drainage:  Purulent and bloody   Drainage amount:  Moderate   Wound treatment:  Wound left open   Packing materials:  None Post-procedure details:    Patient tolerance of procedure:  Tolerated well, no immediate complications   (including critical care time)  Medications Ordered in UC Medications - No data to display  Initial Impression / Assessment and Plan / UC Course  I have reviewed the triage vital signs and the nursing notes.  Pertinent labs & imaging results that were available during my care of the patient were reviewed by me and considered in my medical decision making (see chart for details).    Abscess of the eyelid with localized cellulitis, I&D performed with drainage, will place on Augmentin twice daily x1 week.  Warm compresses with gentle massage.  Continue to monitor for resolution of symptoms.  Follow-up if developing changes in vision, worsening swelling or pain.Discussed strict return precautions. Patient verbalized understanding and is agreeable with plan.  Final Clinical Impressions(s) / UC Diagnoses   Final diagnoses:  Abscess of eyebrow     Discharge Instructions     Please begin Augmentin twice daily for 1 week  Apply warm compresses/hot rags to area  with massage to express further drainage especially the first 24-48 hours  Return if symptoms returning or not improving   ED Prescriptions    Medication Sig Dispense Auth. Provider   amoxicillin-clavulanate (AUGMENTIN) 875-125 MG tablet Take 1 tablet by mouth every 12 (twelve) hours. 14 tablet , [place] C, PA-C     Controlled Substance Prescriptions [place] Controlled Substance Registry consulted? Not Applicable   Lew Dawes,  C, [place] 09/08/18 1226

## 2018-09-08 NOTE — ED Triage Notes (Signed)
Pimple to left eye brow.  Used a "mask" recommended and the next day was swollen and painful.  Patient has had a similar place prior to this.  Patient has an appt scheduled on Friday with dermatologist.    Denies any squeezing, poking or scratching of pimple.  Is using warm cloths

## 2018-09-08 NOTE — Discharge Instructions (Signed)
Please begin Augmentin twice daily for 1 week ° °Apply warm compresses/hot rags to area with massage to express further drainage especially the first 24-48 hours ° °Return if symptoms returning or not improving °

## 2020-03-11 ENCOUNTER — Ambulatory Visit
Admission: EM | Admit: 2020-03-11 | Discharge: 2020-03-11 | Disposition: A | Discharge status: Home / Self Care | Payer: Medicaid Other | Attending: Emergency Medicine | Admitting: Emergency Medicine

## 2020-03-11 DIAGNOSIS — L0201 Cutaneous abscess of face: Secondary | ICD-10-CM

## 2020-03-11 MED ORDER — AMOXICILLIN-POT CLAVULANATE 875-125 MG PO TABS: 1.0000 | ORAL_TABLET | Freq: Two times a day (BID) | ORAL | 0 refills | Status: AC

## 2020-03-11 NOTE — Discharge Instructions (Signed)
Keep area(s) clean and dry. °Apply hot compress / towel for 5-10 minutes 3-5 times daily. °Take antibiotic as prescribed with food - important to complete course. °Return for worsening pain, redness, swelling, discharge, fever. ° °Helpful prevention tips: °Keep nails short to avoid secondary skin infections. °Use new, clean razors when shaving. °Avoid antiperspirants - look for deodorants without aluminum. °Avoid wearing underwire bras as this can irritate the area further.  °

## 2020-03-11 NOTE — ED Triage Notes (Signed)
Pt c/o abscess to rt lower cheek area on face since Sunday.

## 2020-03-11 NOTE — ED Provider Notes (Signed)
[name]    CSN: [id number] Arrival date & time: 03/11/20  1004      History   Chief Complaint Chief Complaint  Patient presents with  . Abscess    HPI Judy Baird is a 44 y.o. female presenting for abscess to right lower jaw.  States she noticed it Sunday.  Has tried hot compresses without relief.  Endorsing mild ear and jaw pain.  No dental pain, throat pain, fever, difficulty breathing or swallowing.   Past Medical History:  Diagnosis Date  . BV (bacterial vaginosis)   . Cellulitis 2011   c/s scar revision  . Chlamydia   . Dysmenorrhea   . Enlarged uterus   . HSV-2 infection   . No pertinent past medical history   . Seroma     Patient Active Problem List   Diagnosis Date Noted  . Bilateral low back pain without sciatica 12/26/2017  . Right shoulder pain 11/13/2016  . 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 Menorrhagia 05/07/2014  . Fatigue 09/12/2013  . Insomnia 09/12/2013  . Enlarged uterus   . Dysmenorrhea   . HSV-2 infection     Past Surgical History:  Procedure Laterality Date  . CESAREAN SECTION  2001, 2010  . CESAREAN SECTION W/BTL  3614,4315   Dr Leo Grosser  . LAPAROSCOPIC ASSISTED VAGINAL HYSTERECTOMY N/A 05/07/2014   Procedure: LAPAROSCOPIC ASSISTED VAGINAL HYSTERECTOMY;  Surgeon: Eldred Manges, MD;  Location: [facility];  Service: Gynecology;  Laterality: N/A;  . LAPAROSCOPY  07/26/2012   Procedure: LAPAROSCOPY OPERATIVE;  Surgeon: Eldred Manges, MD;  Location: [facility];  Service: Gynecology;  Laterality: N/A;  Open Laparoscopy 90 minutes  . REVISION OF ABDOMINAL SCAR  2011   Dr Leo [facility]er at [facility], for cyclic abd pain  . right knee surgery     acl reconstruction  . TUBAL LIGATION      OB History    Gravida  2   Para  2   Term      Preterm      AB      Living  2     SAB      TAB      Ectopic      Multiple      Live Births           Obstetric Comments  Cesarean section 2 yrs  ago, and tubal ligation, with postop surgical complication(cyclic severe abd pain) with wound revieison  requiring repeat surgery at [facility] of [facility] with no pathologic findings         Home Medications    Prior to Admission medications   Medication Sig Start Date End Date Taking? Authorizing Provider  amoxicillin-clavulanate (AUGMENTIN) 875-125 MG tablet Take 1 tablet by mouth every 12 (twelve) hours for 7 days. 03/11/20 03/18/20  Hall-Potvin, [place], PA-C    Family History Family History  Problem Relation Age of Onset  . Diabetes Maternal Grandfather   . Cancer Maternal Grandfather   . Diabetes Maternal Aunt   . Hypertension Father   . Hyperlipidemia Father   . Hypertension Paternal Grandmother   . Hypertension Maternal Grandmother   . Asthma Brother     Social History Social History   Tobacco Use  . Smoking status: Never Smoker  . Smokeless tobacco: Never Used  Substance Use Topics  . Alcohol use: Yes    Comment: social  . Drug use: No     Allergies   Patient has no  known allergies.   Review of Systems As per HPI   Physical Exam Triage Vital Signs ED Triage Vitals  Enc Vitals Group     BP      Pulse      Resp      Temp      Temp src      SpO2      Weight      Height      Head Circumference      Peak Flow      Pain Score      Pain Loc      Pain Edu?      Excl. in GC?    No data found.  Updated Vital Signs BP 103/68 (BP Location: Left Arm)   Pulse 94   Temp 98.1 F (36.7 C) (Oral)   Resp 16   LMP 04/04/2013   SpO2 96%   Visual Acuity Right Eye Distance:   Left Eye Distance:   Bilateral Distance:    Right Eye Near:   Left Eye Near:    Bilateral Near:     Physical Exam Constitutional:      General: She is not in acute distress. HENT:     Head: Normocephalic and atraumatic.  Eyes:     General: No scleral icterus.    Pupils: Pupils are equal, round, and reactive to light.  Cardiovascular:     Rate and Rhythm:  Normal rate.  Pulmonary:     Effort: Pulmonary effort is normal.  Skin:    Coloration: Skin is not jaundiced or pale.     Comments: 2 cm abscess with erythema, warmth, fluctuance and tenderness noted to middle of right lower jaw.  No open wound or active discharge.  Neurological:     Mental Status: She is alert and oriented to person, place, and time.      UC Treatments / Results  Labs (all labs ordered are listed, but only abnormal results are displayed) Labs Reviewed - No data to display  EKG   Radiology No results found.  Procedures Incision and Drainage  Date/Time: 03/11/2020 10:54 AM Performed by: Shea Evans, PA-C Authorized by: Shea Evans, PA-C   Consent:    Consent obtained:  Verbal   Consent given by:  Patient   Risks discussed:  Bleeding, incomplete drainage, pain and damage to other organs   Alternatives discussed:  No treatment Universal protocol:    Patient identity confirmed:  Verbally with patient Location:    Type:  Abscess   Size:  2cm   Location: right jaw. Pre-procedure details:    Skin preparation:  Betadine Anesthesia (see MAR for exact dosages):    Anesthesia method:  Local infiltration   Local anesthetic:  Lidocaine 2% w/o epi Procedure type:    Complexity:  Simple Procedure details:    Needle aspiration: no     Incision types:  Stab incision   Incision depth:  Subcutaneous   Scalpel blade:  11   Wound management:  Probed and deloculated, irrigated with saline and extensive cleaning   Drainage:  Purulent and bloody   Drainage amount:  Copious   Wound treatment:  Wound left open   Packing materials:  None Post-procedure details:    Patient tolerance of procedure:  Tolerated well, no immediate complications   (including critical care time)  Medications Ordered in UC Medications - No data to display  Initial Impression / Assessment and Plan / UC Course  I have reviewed the triage  vital signs and the nursing  notes.  Pertinent labs & imaging results that were available during my care of the patient were reviewed by me and considered in my medical decision making (see chart for details).     Patient afebrile, nontoxic in office today.  I&D performed which patient tolerated well.  Reviewed hot compress, supportive care, and antibiotic regimen.  Return precautions discussed, patient verbalized understanding and is agreeable to plan. Final Clinical Impressions(s) / UC Diagnoses   Final diagnoses:  Acute abscess of face     Discharge Instructions     Keep area(s) clean and dry. Apply hot compress / towel for 5-10 minutes 3-5 times daily. Take antibiotic as prescribed with food - important to complete course. Return for worsening pain, redness, swelling, discharge, fever.  Helpful prevention tips: Keep nails short to avoid secondary skin infections. Use new, clean razors when shaving. Avoid antiperspirants - look for deodorants without aluminum. Avoid wearing underwire bras as this can irritate the area further.     ED Prescriptions    Medication Sig Dispense Auth. Provider   amoxicillin-clavulanate (AUGMENTIN) 875-125 MG tablet Take 1 tablet by mouth every 12 (twelve) hours for 7 days. 14 tablet Hall-Potvin, [place], PA-C     PDMP not reviewed this encounter.   Hall-Potvin, [place], [place] 03/11/20 1055

## 2020-06-25 DIAGNOSIS — Z20822 Contact with and (suspected) exposure to covid-19: Secondary | ICD-10-CM | POA: Diagnosis not present

## 2021-07-20 DIAGNOSIS — F4323 Adjustment disorder with mixed anxiety and depressed mood: Secondary | ICD-10-CM | POA: Diagnosis not present

## 2021-07-27 DIAGNOSIS — F4323 Adjustment disorder with mixed anxiety and depressed mood: Secondary | ICD-10-CM | POA: Diagnosis not present

## 2021-08-03 DIAGNOSIS — F4323 Adjustment disorder with mixed anxiety and depressed mood: Secondary | ICD-10-CM | POA: Diagnosis not present

## 2021-08-04 DIAGNOSIS — Z1272 Encounter for screening for malignant neoplasm of vagina: Secondary | ICD-10-CM | POA: Diagnosis not present

## 2021-08-04 DIAGNOSIS — Z124 Encounter for screening for malignant neoplasm of cervix: Secondary | ICD-10-CM | POA: Diagnosis not present

## 2021-08-04 DIAGNOSIS — R87811 Vaginal high risk human papillomavirus (HPV) DNA test positive: Secondary | ICD-10-CM | POA: Diagnosis not present

## 2021-08-04 DIAGNOSIS — L732 Hidradenitis suppurativa: Secondary | ICD-10-CM | POA: Diagnosis not present

## 2021-08-23 DIAGNOSIS — Z23 Encounter for immunization: Secondary | ICD-10-CM | POA: Diagnosis not present

## 2021-08-23 DIAGNOSIS — Z79899 Other long term (current) drug therapy: Secondary | ICD-10-CM | POA: Diagnosis not present

## 2021-08-23 DIAGNOSIS — L732 Hidradenitis suppurativa: Secondary | ICD-10-CM | POA: Diagnosis not present

## 2021-08-23 DIAGNOSIS — L730 Acne keloid: Secondary | ICD-10-CM | POA: Diagnosis not present

## 2021-08-24 DIAGNOSIS — F4323 Adjustment disorder with mixed anxiety and depressed mood: Secondary | ICD-10-CM | POA: Diagnosis not present

## 2021-09-14 DIAGNOSIS — F4323 Adjustment disorder with mixed anxiety and depressed mood: Secondary | ICD-10-CM | POA: Diagnosis not present

## 2021-09-22 DIAGNOSIS — Z1231 Encounter for screening mammogram for malignant neoplasm of breast: Secondary | ICD-10-CM | POA: Diagnosis not present

## 2021-11-25 ENCOUNTER — Other Ambulatory Visit: Payer: Self-pay

## 2021-11-25 ENCOUNTER — Ambulatory Visit
Admission: EM | Admit: 2021-11-25 | Discharge: 2021-11-25 | Disposition: A | Discharge status: Home / Self Care | Payer: BC Managed Care – PPO | Attending: Physician Assistant | Admitting: Physician Assistant

## 2021-11-25 ENCOUNTER — Ambulatory Visit: Admit: 2021-11-25 | Disposition: A | Discharge status: Home / Self Care | Payer: BC Managed Care – PPO

## 2021-11-25 DIAGNOSIS — M79622 Pain in left upper arm: Secondary | ICD-10-CM

## 2021-11-25 MED ORDER — PREDNISONE 20 MG PO TABS: 40.0000 mg | ORAL_TABLET | Freq: Every day | ORAL | 0 refills | Status: AC

## 2021-11-25 NOTE — ED Triage Notes (Signed)
Pt c/o left arm pain in the deltoid area that is causing restricted range of motion. States it is achy and she is not sure what may have caused it. Onset ~ 6 days ago.

## 2021-11-25 NOTE — ED Provider Notes (Signed)
[name]    CSN: [id number] Arrival date & time: 11/25/21  1559      History   Chief Complaint Chief Complaint  Patient presents with   left arm pain    HPI Judy Baird is a 47 y.o. female.   Patient here today for evaluation of left arm pain that started for 5 days ago.  She reports that she woke with pain and has had trouble with movement of her arm above shoulder level since that time.  She denies any known injury.  She does note similar presentation to her left ankle about 2 months ago but this resolved.  She states she has been taking ibuprofen without significant relief.  She has had some numbness to her fingers at times and questions if symptoms may be related to a pinched nerve.  The history is provided by the patient.   Past Medical History:  Diagnosis Date   BV (bacterial vaginosis)    Cellulitis 2011   c/s scar revision   Chlamydia    Dysmenorrhea    Enlarged uterus    HSV-2 infection    No pertinent past medical history    Seroma     Patient Active Problem List   Diagnosis Date Noted   Bilateral low back pain without sciatica 12/26/2017   Right shoulder pain 11/13/2016   S/P laparoscopic assisted vaginal hysterectomy (LAVH) 05/08/2014   Menorrhagia 05/07/2014   Fatigue 09/12/2013   Insomnia 09/12/2013   Enlarged uterus    Dysmenorrhea    HSV-2 infection     Past Surgical History:  Procedure Laterality Date   CESAREAN SECTION  2001, 2010   CESAREAN SECTION W/BTL  2001,2010   Dr Pennie Rushing   LAPAROSCOPIC ASSISTED VAGINAL HYSTERECTOMY N/A 05/07/2014   Procedure: LAPAROSCOPIC ASSISTED VAGINAL HYSTERECTOMY;  Surgeon: Hal Morales, MD;  Location: [facility];  Service: Gynecology;  Laterality: N/A;   LAPAROSCOPY  07/26/2012   Procedure: LAPAROSCOPY OPERATIVE;  Surgeon: Hal Morales, MD;  Location: [facility];  Service: Gynecology;  Laterality: N/A;  Open Laparoscopy 90 minutes   REVISION OF ABDOMINAL SCAR  2011   Dr Pennie Rushing at [facility], for  cyclic abd pain   right knee surgery     acl reconstruction   TUBAL LIGATION      OB History     Gravida  2   Para  2   Term      Preterm      AB      Living  2      SAB      IAB      Ectopic      Multiple      Live Births           Obstetric Comments  Cesarean section 2 yrs ago, and tubal ligation, with postop surgical complication(cyclic severe abd pain) with wound revieison  requiring repeat surgery at [facility] with no pathologic findings          Home Medications    Prior to Admission medications   Medication Sig Start Date End Date Taking? Authorizing Provider  predniSONE (DELTASONE) 20 MG tablet Take 2 tablets (40 mg total) by mouth daily with breakfast for 5 days. 11/25/21 11/30/21 Yes Tomi Bamberger, PA-C    Family History Family History  Problem Relation Age of Onset   Diabetes Maternal Grandfather    Cancer Maternal Grandfather    Diabetes Maternal Aunt    Hypertension Father  Hyperlipidemia Father    Hypertension Paternal Grandmother    Hypertension Maternal Grandmother    Asthma Brother     Social History Social History   Tobacco Use   Smoking status: Never   Smokeless tobacco: Never  Vaping Use   Vaping Use: Never used  Substance Use Topics   Alcohol use: Yes    Comment: social   Drug use: No     Allergies   Patient has no known allergies.   Review of Systems Review of Systems  Constitutional:  Negative for chills and fever.  Eyes:  Negative for discharge and redness.  Gastrointestinal:  Negative for abdominal pain, nausea and vomiting.  Musculoskeletal:  Positive for arthralgias and myalgias.    Physical Exam Triage Vital Signs ED Triage Vitals  Enc Vitals Group     BP      Pulse      Resp      Temp      Temp src      SpO2      Weight      Height      Head Circumference      Peak Flow      Pain Score      Pain Loc      Pain Edu?      Excl. in [place]?    No data  found.  Updated Vital Signs BP 118/74 (BP Location: Right Arm)    Pulse 86    Temp 98.1 F (36.7 C) (Oral)    Resp 18    LMP 04/04/2013    SpO2 98%   Physical Exam Vitals and nursing note reviewed.  Constitutional:      General: She is not in acute distress.    Appearance: Normal appearance. She is not ill-appearing.  HENT:     Head: Normocephalic and atraumatic.  Eyes:     Conjunctiva/sclera: Conjunctivae normal.  Cardiovascular:     Rate and Rhythm: Normal rate.  Pulmonary:     Effort: Pulmonary effort is normal.  Musculoskeletal:     Comments: Mildly decreased range of motion of left upper arm/shoulder due to pain.  Increased pain noted when arm is elevated to shoulder level.  Mild tenderness to palpation to anterior shoulder and upper arm.  Neurological:     Mental Status: She is alert.     Comments: Grip strength 5 out of 5 bilaterally  Psychiatric:        Mood and Affect: Mood normal.        Behavior: Behavior normal.        Thought Content: Thought content normal.     UC Treatments / Results  Labs (all labs ordered are listed, but only abnormal results are displayed) Labs Reviewed - No data to display  EKG   Radiology No results found.  Procedures Procedures (including critical care time)  Medications Ordered in UC Medications - No data to display  Initial Impression / Assessment and Plan / UC Course  I have reviewed the triage vital signs and the nursing notes.  Pertinent labs & imaging results that were available during my care of the patient were reviewed by me and considered in my medical decision making (see chart for details).    Unclear etiology of symptoms but given worsening with movement suspect likely musculoskeletal.  Will treat with steroid burst with strict instruction to follow-up with Ortho if symptoms do not improve.  Encourage sooner follow-up with any worsening.  Final Clinical Impressions(s) /  UC Diagnoses   Final diagnoses:  Left  upper arm pain   Discharge Instructions   None    ED Prescriptions     Medication Sig Dispense Auth. Provider   predniSONE (DELTASONE) 20 MG tablet Take 2 tablets (40 mg total) by mouth daily with breakfast for 5 days. 10 tablet Francene Finders, PA-C      PDMP not reviewed this encounter.   Francene Finders, PA-C 11/25/21 1735

## 2022-02-07 DIAGNOSIS — Z0000 Encounter for general adult medical examination without abnormal findings: Secondary | ICD-10-CM | POA: Diagnosis not present

## 2022-02-07 DIAGNOSIS — R5383 Other fatigue: Secondary | ICD-10-CM | POA: Diagnosis not present

## 2022-02-07 DIAGNOSIS — Z872 Personal history of diseases of the skin and subcutaneous tissue: Secondary | ICD-10-CM | POA: Diagnosis not present

## 2022-02-07 DIAGNOSIS — R7301 Impaired fasting glucose: Secondary | ICD-10-CM | POA: Diagnosis not present

## 2022-03-14 ENCOUNTER — Encounter: Payer: Self-pay | Admitting: *Deleted

## 2022-03-14 DIAGNOSIS — M25572 Pain in left ankle and joints of left foot: Secondary | ICD-10-CM | POA: Diagnosis not present

## 2022-05-24 DIAGNOSIS — E0590 Thyrotoxicosis, unspecified without thyrotoxic crisis or storm: Secondary | ICD-10-CM | POA: Diagnosis not present

## 2022-05-29 ENCOUNTER — Other Ambulatory Visit: Payer: Self-pay

## 2022-05-29 DIAGNOSIS — E0590 Thyrotoxicosis, unspecified without thyrotoxic crisis or storm: Secondary | ICD-10-CM | POA: Diagnosis not present

## 2022-05-29 DIAGNOSIS — M25512 Pain in left shoulder: Secondary | ICD-10-CM | POA: Diagnosis not present

## 2022-05-29 DIAGNOSIS — M25572 Pain in left ankle and joints of left foot: Secondary | ICD-10-CM | POA: Diagnosis not present

## 2022-06-01 ENCOUNTER — Ambulatory Visit
Admission: RE | Admit: 2022-06-01 | Discharge: 2022-06-01 | Disposition: A | Discharge status: Home / Self Care | Payer: BC Managed Care – PPO | Source: Ambulatory Visit

## 2022-06-01 ENCOUNTER — Other Ambulatory Visit: Payer: BC Managed Care – PPO

## 2022-06-01 DIAGNOSIS — E0590 Thyrotoxicosis, unspecified without thyrotoxic crisis or storm: Secondary | ICD-10-CM | POA: Diagnosis not present

## 2022-06-01 DIAGNOSIS — E041 Nontoxic single thyroid nodule: Secondary | ICD-10-CM | POA: Diagnosis not present

## 2022-07-18 DIAGNOSIS — F4323 Adjustment disorder with mixed anxiety and depressed mood: Secondary | ICD-10-CM | POA: Diagnosis not present

## 2022-09-26 DIAGNOSIS — M25572 Pain in left ankle and joints of left foot: Secondary | ICD-10-CM | POA: Diagnosis not present

## 2022-10-12 DIAGNOSIS — M25572 Pain in left ankle and joints of left foot: Secondary | ICD-10-CM | POA: Diagnosis not present

## 2022-12-05 DIAGNOSIS — Z6825 Body mass index (BMI) 25.0-25.9, adult: Secondary | ICD-10-CM | POA: Diagnosis not present

## 2022-12-05 DIAGNOSIS — Z01419 Encounter for gynecological examination (general) (routine) without abnormal findings: Secondary | ICD-10-CM | POA: Diagnosis not present

## 2022-12-05 DIAGNOSIS — Z1231 Encounter for screening mammogram for malignant neoplasm of breast: Secondary | ICD-10-CM | POA: Diagnosis not present

## 2022-12-05 DIAGNOSIS — Z1272 Encounter for screening for malignant neoplasm of vagina: Secondary | ICD-10-CM | POA: Diagnosis not present

## 2023-03-23 DIAGNOSIS — M25572 Pain in left ankle and joints of left foot: Secondary | ICD-10-CM | POA: Diagnosis not present

## 2023-04-09 DIAGNOSIS — M25572 Pain in left ankle and joints of left foot: Secondary | ICD-10-CM | POA: Diagnosis not present

## 2023-10-09 ENCOUNTER — Telehealth: Payer: BC Managed Care – PPO

## 2023-10-09 ENCOUNTER — Telehealth: Payer: BC Managed Care – PPO | Admitting: Nurse Practitioner

## 2023-10-09 DIAGNOSIS — R238 Other skin changes: Secondary | ICD-10-CM | POA: Diagnosis not present

## 2023-10-09 NOTE — Progress Notes (Signed)
 Virtual Visit Consent   Judy Baird, you are scheduled for a virtual visit with a [facility] provider today. Just as with appointments in the office, your consent must be obtained to participate. Your consent will be active for this visit and any virtual visit you may have with one of our providers in the next 365 days. If you have a MyChart account, a copy of this consent can be sent to you electronically.  As this is a virtual visit, video technology does not allow for your provider to perform a traditional examination. This may limit your provider's ability to fully assess your condition. If your provider identifies any concerns that need to be evaluated in person or the need to arrange testing (such as labs, EKG, etc.), we will make arrangements to do so. Although advances in technology are sophisticated, we cannot ensure that it will always work on either your end or our end. If the connection with a video visit is poor, the visit may have to be switched to a telephone visit. With either a video or telephone visit, we are not always able to ensure that we have a secure connection.  By engaging in this virtual visit, you consent to the provision of healthcare and authorize for your insurance to be billed (if applicable) for the services provided during this visit. Depending on your insurance coverage, you may receive a charge related to this service.  I need to obtain your verbal consent now. Are you willing to proceed with your visit today? Judy Baird has provided verbal consent on 10/09/2023 for a virtual visit (video or telephone). Lauraine Kitty, FNP  Date: 10/09/2023 4:05 PM  Virtual Visit via Video Note   I, Lauraine Kitty, connected with  Judy Baird  ([id number], Aug 05, 1976) on 10/09/23 at  4:15 PM EST by a video-enabled telemedicine application and verified that I am speaking with the correct person using two identifiers.  Location: Patient: Virtual Visit Location Patient:  Home Provider: Virtual Visit Location Provider: Home Office   I discussed the limitations of evaluation and management by telemedicine and the availability of in person appointments. The patient expressed understanding and agreed to proceed.    History of Present Illness: Judy Baird is a 47 y.o. who identifies as a female who was assigned female at birth, and is being seen today for concern over a mole that she recently noted on her back   The mole has developed a scab on it and it tender to touch when something rubs it  The mold is right under her bra strap and that rubs and irritates the mole   Denies that it has grown in size   She has seen a dermatologist in the past but not recently  She has not needed biopsies or had any diagnosis of skin CA   She has an appointment with her PCP in June  Due for OBGYN in April      Problems:  Patient Active Problem List   Diagnosis Date Noted   Bilateral low back pain without sciatica 12/26/2017   Right shoulder pain 11/13/2016   S/P laparoscopic assisted vaginal hysterectomy (LAVH) 05/08/2014   Menorrhagia 05/07/2014   Fatigue 09/12/2013   Insomnia 09/12/2013   Enlarged uterus    Dysmenorrhea    HSV-2 infection     Allergies: No Known Allergies Medications: No current outpatient medications on file.  Observations/Objective: Patient is well-developed, well-nourished in no acute distress.  Resting comfortably  at home.  Head is normocephalic, atraumatic.  No labored breathing.  Speech is clear and coherent with logical content.  Patient is alert and oriented at baseline.    Assessment and Plan:  1. Skin irritation (Primary) Avoid irritation by switching to a bra that does not rub the area  Apply a topical ointment based moisturizer to the area (Aquaphor/antibiotic ointment)  Call dermatology for consultation/ skin screening    Follow up with signs of infection as needed as discussed    Follow Up Instructions: I  discussed the assessment and treatment plan with the patient. The patient was provided an opportunity to ask questions and all were answered. The patient agreed with the plan and demonstrated an understanding of the instructions.  A copy of instructions were sent to the patient via MyChart unless otherwise noted below.    The patient was advised to call back or seek an in-person evaluation if the symptoms worsen or if the condition fails to improve as anticipated.    Lauraine Kitty, FNP

## 2023-12-20 DIAGNOSIS — D125 Benign neoplasm of sigmoid colon: Secondary | ICD-10-CM | POA: Diagnosis not present

## 2023-12-20 DIAGNOSIS — Z1211 Encounter for screening for malignant neoplasm of colon: Secondary | ICD-10-CM | POA: Diagnosis not present

## 2023-12-20 DIAGNOSIS — D126 Benign neoplasm of colon, unspecified: Secondary | ICD-10-CM | POA: Diagnosis not present

## 2023-12-20 DIAGNOSIS — K635 Polyp of colon: Secondary | ICD-10-CM | POA: Diagnosis not present

## 2023-12-20 DIAGNOSIS — D128 Benign neoplasm of rectum: Secondary | ICD-10-CM | POA: Diagnosis not present

## 2023-12-20 LAB — HM COLONOSCOPY

## 2024-01-23 DIAGNOSIS — Z01419 Encounter for gynecological examination (general) (routine) without abnormal findings: Secondary | ICD-10-CM | POA: Diagnosis not present

## 2024-01-23 DIAGNOSIS — Z1272 Encounter for screening for malignant neoplasm of vagina: Secondary | ICD-10-CM | POA: Diagnosis not present

## 2024-01-23 DIAGNOSIS — Z1231 Encounter for screening mammogram for malignant neoplasm of breast: Secondary | ICD-10-CM | POA: Diagnosis not present

## 2024-02-28 ENCOUNTER — Ambulatory Visit (INDEPENDENT_AMBULATORY_CARE_PROVIDER_SITE_OTHER): Admitting: Family Medicine

## 2024-02-28 ENCOUNTER — Encounter: Payer: Self-pay | Admitting: Family Medicine

## 2024-02-28 VITALS — BP 98/74 | HR 98 | Temp 98.1°F | Ht 63.0 in | Wt 136.2 lb

## 2024-02-28 DIAGNOSIS — N951 Menopausal and female climacteric states: Secondary | ICD-10-CM

## 2024-02-28 DIAGNOSIS — Z79890 Hormone replacement therapy (postmenopausal): Secondary | ICD-10-CM

## 2024-02-28 DIAGNOSIS — Z7689 Persons encountering health services in other specified circumstances: Secondary | ICD-10-CM

## 2024-02-28 DIAGNOSIS — Z90710 Acquired absence of both cervix and uterus: Secondary | ICD-10-CM | POA: Diagnosis not present

## 2024-02-28 DIAGNOSIS — L732 Hidradenitis suppurativa: Secondary | ICD-10-CM | POA: Diagnosis not present

## 2024-02-28 NOTE — Progress Notes (Signed)
 Established Patient Office Visit   Subjective  Patient ID: Judy Baird, female    DOB: 09/07/1976  Age: 48 y.o. MRN: [id number]  Chief Complaint  Patient presents with   New Patient (Initial Visit)    Patient is a 48 year old female.  Follow-up on ongoing concerns.  Previously seen by Judy Cava, NP at [facility].  Patient states she had recent blood work and a physical through her job.  Also had a [facility] at [facility].  Has copy of report.  Mammogram done 01/2024.  Perimenopausal symptoms: Endorses hot flashes, insomnia, mood swings, joint pain/muscle aches.  Started on estradiol  0.05 milligram patch by gynecology, [facility]  Notes improvement in symptoms especially sleep.  Patient had hysterectomy in 2015.  Hidradenitis suppurativa: Patient notes painful bumps on chin, lateral neck, groin.  Has a new erythematous area on left cheek that drains when pressed.  Bilateral groin lesions are tunneling and constantly draining.  Had surgery on bilateral axilla to remove HS lesions and sweat glands.  Using clindamycin ointment and ivory soap on skin.  Also with scarring on face and hyperpigmentation from acne lesions.  Has not seen dermatology.  Requesting referral.  History of gestational diabetes: Pregnancy x 2.  No issues since.  Allergies: NKDA  Past surgical history C-section x 2, bilateral axilla surgery for HS.  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  Patient has been working with a company for 7 yrs in Pharmacist, community for a warehouse.  Patient has to adult daughters.  Patient endorses rare social alcohol use.  Denies tobacco or drug use.  Family medical history: Sarcoidosis in dad side of family including a great aunt and grandmother Judy Baird Maternal aunt diabetes     Patient Active Problem List   Diagnosis Date Noted   Bilateral low back pain without sciatica 12/26/2017   Right  shoulder pain 11/13/2016   S/P laparoscopic assisted vaginal hysterectomy (LAVH) 05/08/2014   Menorrhagia 05/07/2014   Fatigue 09/12/2013   Insomnia 09/12/2013   Enlarged uterus    Dysmenorrhea    HSV-2 infection    Past Medical History:  Diagnosis Date   BV (bacterial vaginosis)    Cellulitis 2011   c/s scar revision   Chlamydia    Dysmenorrhea    Enlarged uterus    HSV-2 infection    No pertinent past medical history    Seroma    Past Surgical History:  Procedure Laterality Date   ABDOMINAL HYSTERECTOMY  2015   CESAREAN SECTION  2001, 2010   CESAREAN SECTION W/BTL  2001,2010   [facility]   LAPAROSCOPIC ASSISTED VAGINAL HYSTERECTOMY N/A 05/07/2014   Procedure: LAPAROSCOPIC ASSISTED VAGINAL HYSTERECTOMY;  Surgeon: Judy Elbe, MD;  Location: [facility];  Service: Gynecology;  Laterality: N/A;   LAPAROSCOPY  07/26/2012   Procedure: LAPAROSCOPY OPERATIVE;  Surgeon: Judy Elbe, MD;  Location: [facility];  Service: Gynecology;  Laterality: N/A;  Open Laparoscopy 90 minutes   REVISION OF ABDOMINAL SCAR  10/09/2009   [facility] at [facility], for cyclic abd pain   right knee surgery     acl reconstruction   TUBAL LIGATION     Social History   Tobacco Use   Smoking status: Never   Smokeless tobacco: Never  Vaping Use   Vaping status: Never Used  Substance Use Topics   Alcohol use: Yes    Alcohol/week: 3.0 standard drinks of alcohol    Types:  1 Glasses of wine, 2 Standard drinks or equivalent per week    Comment: social   Drug use: No   Family History  Problem Relation Age of Onset   Diabetes Maternal Grandfather    Baird Maternal Grandfather    Diabetes Maternal Aunt    Hypertension Father    Hyperlipidemia Father    Hypertension Paternal Grandmother    Hypertension Maternal Grandmother    Asthma Brother    ADD / ADHD Daughter    Asthma Daughter    No Known Allergies  ROS Negative unless stated above    Objective:      BP 98/74 (BP Location: Left  Arm, Patient Position: Sitting, Cuff Size: Normal)   Pulse 98   Temp 98.1 F (36.7 C) (Oral)   Ht 5\' 3"  (1.6 m)   Wt 136 lb 3.2 oz (61.8 kg)   LMP 04/04/2013   SpO2 96%   BMI 24.13 kg/m  BP Readings from Last 3 Encounters:  02/28/24 98/74  11/25/21 118/74  03/11/20 103/68   Wt Readings from Last 3 Encounters:  02/28/24 136 lb 3.2 oz (61.8 kg)  06/13/18 140 lb 6.9 oz (63.7 kg)  12/25/17 138 lb (62.6 kg)      Physical Exam Constitutional:      General: She is not in acute distress.    Appearance: Normal appearance.  HENT:     Head: Normocephalic and atraumatic.     Nose: Nose normal.     Mouth/Throat:     Mouth: Mucous membranes are moist.  Cardiovascular:     Rate and Rhythm: Normal rate and regular rhythm.     Heart sounds: Normal heart sounds. No murmur heard.    No gallop.  Pulmonary:     Effort: Pulmonary effort is normal. No respiratory distress.     Breath sounds: Normal breath sounds. No wheezing, rhonchi or rales.  Skin:    General: Skin is warm and dry.     Comments: Erythematous circular area on left cheek, flat.  Visible pores/ depressions on skin of face and neck.  Neurological:     Mental Status: She is alert and oriented to person, place, and time.        12/25/2017    3:54 PM 05/22/2017    4:35 PM 11/09/2016    2:20 PM  Depression screen PHQ 2/9  Decreased Interest 0 0 0  Down, Depressed, Hopeless 0 0 0  PHQ - 2 Score 0 0 0       No data to display           No results found for any visits on 02/28/24.    Assessment & Plan:   Hidradenitis suppurativa -     Ambulatory referral to Dermatology  S/P laparoscopic assisted vaginal hysterectomy (LAVH)  Perimenopause  Hormone replacement therapy (HRT)  Encounter to establish care  Discussed supportive care for HS symptoms including OTC Hibiclens .  Discussed using gentle soaps and moisturizers.  Referral placed to dermatology.  For bilateral groin lesions discussed likely need for  referral to general surgery for removal due to tunneling and constant drainage.  Patient to decide.  Will place referral at that time.  Perimenopausal symptoms improving since starting estradiol  patch.  Continue patch per gynecology.  Obtain records from previous provider.   Return if symptoms worsen or fail to improve.   Viola Greulich, MD

## 2024-02-28 NOTE — Patient Instructions (Signed)
 Hibiclens  can be found over-the-counter at your local drugstore.

## 2024-03-12 ENCOUNTER — Ambulatory Visit: Payer: BC Managed Care – PPO | Admitting: Family Medicine

## 2024-05-21 ENCOUNTER — Ambulatory Visit: Admitting: Family Medicine

## 2024-05-21 VITALS — BP 124/68 | HR 114 | Temp 98.4°F | Ht 63.0 in | Wt >= 6400 oz

## 2024-05-21 DIAGNOSIS — N3001 Acute cystitis with hematuria: Secondary | ICD-10-CM | POA: Diagnosis not present

## 2024-05-21 DIAGNOSIS — R300 Dysuria: Secondary | ICD-10-CM

## 2024-05-21 DIAGNOSIS — R339 Retention of urine, unspecified: Secondary | ICD-10-CM

## 2024-05-21 LAB — POC URINALSYSI DIPSTICK (AUTOMATED)
Bilirubin, UA: NEGATIVE
Blood, UA: POSITIVE
Glucose, UA: NEGATIVE
Ketones, UA: NEGATIVE
Nitrite, UA: NEGATIVE
Protein, UA: NEGATIVE
Spec Grav, UA: 1.015 (ref 1.010–1.025)
Urobilinogen, UA: 0.2 U/dL
pH, UA: 7 (ref 5.0–8.0)

## 2024-05-21 MED ORDER — PHENAZOPYRIDINE HCL 100 MG PO TABS: 100.0000 mg | ORAL_TABLET | Freq: Three times a day (TID) | ORAL | 0 refills | Status: AC | PRN

## 2024-05-21 MED ORDER — NITROFURANTOIN MONOHYD MACRO 100 MG PO CAPS: 100.0000 mg | ORAL_CAPSULE | Freq: Two times a day (BID) | ORAL | 0 refills | Status: AC

## 2024-05-21 NOTE — Progress Notes (Signed)
 Established Patient Office Visit   Subjective  Patient ID: Judy Baird, female    DOB: [birth year]  Age: 48 y.o. MRN: [id number]  Chief Complaint  Patient presents with   Acute Visit    Patient came n today for UTI symptoms, started 3 days ago, Burning/pressure, Retention, abdominal pain     Pt is a 48 year old female seen for acute concern.  Patient endorses discomfort in the end of urinary stream x 3 days.  Denies overt dysuria, frequency, increased odor, urgency, back pain, nausea, vomiting, vaginal d/c, constipation, changes in products.  Symptoms started after a trip to the beach.      Patient Active Problem List   Diagnosis Date Noted   Bilateral low back pain without sciatica 12/26/2017   Right shoulder pain 11/13/2016   S/P laparoscopic assisted vaginal hysterectomy (LAVH) 05/08/2014   Menorrhagia 05/07/2014   Fatigue 09/12/2013   Insomnia 09/12/2013   Enlarged uterus    Dysmenorrhea    HSV-2 infection    Past Medical History:  Diagnosis Date   BV (bacterial vaginosis)    Cellulitis 2011   c/s scar revision   Chlamydia    Dysmenorrhea    Enlarged uterus    HSV-2 infection    No pertinent past medical history    Seroma    Past Surgical History:  Procedure Laterality Date   ABDOMINAL HYSTERECTOMY  2015   CESAREAN SECTION  2001, 2010   CESAREAN SECTION W/BTL  2001,2010   Dr Raeanne   LAPAROSCOPIC ASSISTED VAGINAL HYSTERECTOMY N/A 05/07/2014   Procedure: LAPAROSCOPIC ASSISTED VAGINAL HYSTERECTOMY;  Surgeon: Shanda SHAUNNA Raeanne, MD;  Location: [facility];  Service: Gynecology;  Laterality: N/A;   LAPAROSCOPY  07/26/2012   Procedure: LAPAROSCOPY OPERATIVE;  Surgeon: Shanda SHAUNNA Raeanne, MD;  Location: [facility];  Service: Gynecology;  Laterality: N/A;  Open Laparoscopy 90 minutes   REVISION OF ABDOMINAL SCAR  10/09/2009   Dr Raeanne at [facility], for cyclic abd pain   right knee surgery     acl reconstruction   TUBAL LIGATION  08/26/2009   Social History   Tobacco Use    Smoking status: Never   Smokeless tobacco: Never  Vaping Use   Vaping status: Never Used  Substance Use Topics   Alcohol use: Yes    Alcohol/week: 3.0 standard drinks of alcohol    Types: 1 Glasses of wine, 2 Standard drinks or equivalent per week    Comment: social   Drug use: No   Family History  Problem Relation Age of Onset   Diabetes Maternal Grandfather    Cancer Maternal Grandfather    Diabetes Maternal Aunt    Hypertension Father    Hyperlipidemia Father    Hypertension Paternal Grandmother    Hypertension Maternal Grandmother    Asthma Brother    ADD / ADHD Daughter    Asthma Daughter    No Known Allergies  ROS Negative unless stated above    Objective:     BP 124/68 (BP Location: Left Arm, Patient Position: Sitting, Cuff Size: Normal)   Pulse (!) 114   Temp 98.4 F (36.9 C) (Oral)   Ht 5' 3 (1.6 m)   Wt (!) 1418 lb (643.2 kg)   LMP 04/04/2013   SpO2 100%   BMI 251.19 kg/m  BP Readings from Last 3 Encounters:  05/21/24 124/68  02/28/24 98/74  11/25/21 118/74   Wt Readings from Last 3 Encounters:  05/21/24 (!) 1418 lb (643.2 kg)  02/28/24 136 lb  3.2 oz (61.8 kg)  06/13/18 140 lb 6.9 oz (63.7 kg)      Physical Exam Constitutional:      General: She is not in acute distress.    Appearance: Normal appearance.  HENT:     Head: Normocephalic and atraumatic.     Nose: Nose normal.     Mouth/Throat:     Mouth: Mucous membranes are moist.  Cardiovascular:     Rate and Rhythm: Normal rate and regular rhythm.     Heart sounds: Normal heart sounds. No murmur heard.    No gallop.  Pulmonary:     Effort: Pulmonary effort is normal. No respiratory distress.     Breath sounds: Normal breath sounds. No wheezing, rhonchi or rales.  Abdominal:     General: Bowel sounds are normal.     Tenderness: There is no abdominal tenderness. There is no right CVA tenderness, left CVA tenderness or rebound.  Skin:    General: Skin is warm and dry.  Neurological:      Mental Status: She is alert and oriented to person, place, and time.        12/25/2017    3:54 PM 05/22/2017    4:35 PM 11/09/2016    2:20 PM  Depression screen PHQ 2/9  Decreased Interest 0 0 0  Down, Depressed, Hopeless 0 0 0  PHQ - 2 Score 0 0 0       No data to display           Results for orders placed or performed in visit on 05/21/24  POCT Urinalysis Dipstick (Automated)  Result Value Ref Range   Color, UA yellow    Clarity, UA Cloudy    Glucose, UA Negative Negative   Bilirubin, UA neg    Ketones, UA neg    Spec Grav, UA 1.015 1.010 - 1.025   Blood, UA pos    pH, UA 7.0 5.0 - 8.0   Protein, UA Negative Negative   Urobilinogen, UA 0.2 0.2 or 1.0 E.U./dL   Nitrite, UA neg    Leukocytes, UA Trace (A) Negative      Assessment & Plan:   Acute cystitis with hematuria -     Nitrofurantoin  Monohyd Macro; Take 1 capsule (100 mg total) by mouth 2 (two) times daily for 7 days.  Dispense: 14 capsule; Refill: 0  Dysuria -     POCT Urinalysis Dipstick (Automated) -     Urine Culture; Future -     Phenazopyridine  HCl; Take 1 tablet (100 mg total) by mouth 3 (three) times daily as needed for up to 2 days for pain.  Dispense: 6 tablet; Refill: 0  Urine retention -     POCT Urinalysis Dipstick (Automated) -     Urine Culture; Future  Patient with acute urinary symptoms concerning for UTI.  POC UA.  Start Macrobid  and Pyridium .  Obtain UCX.  Encouraged increase p.o. intake of water and fluids.  Further recommendations if needed based on results.  Return if symptoms worsen or fail to improve.   [name], MD

## 2024-05-23 ENCOUNTER — Ambulatory Visit: Payer: Self-pay | Admitting: Family Medicine

## 2024-05-23 DIAGNOSIS — L732 Hidradenitis suppurativa: Secondary | ICD-10-CM

## 2024-05-23 DIAGNOSIS — N390 Urinary tract infection, site not specified: Secondary | ICD-10-CM

## 2024-05-23 LAB — URINE CULTURE
MICRO NUMBER:: 16825847
SPECIMEN QUALITY:: ADEQUATE

## 2024-05-23 MED ORDER — CEPHALEXIN 500 MG PO CAPS: 500.0000 mg | ORAL_CAPSULE | Freq: Two times a day (BID) | ORAL | 0 refills | Status: AC

## 2024-06-06 NOTE — Addendum Note (Signed)
 Addended by: EVELINE LAURAINE BRAVO on: 06/06/2024 09:18 AM   Modules accepted: Orders

## 2024-08-08 DIAGNOSIS — L732 Hidradenitis suppurativa: Secondary | ICD-10-CM | POA: Diagnosis not present

## 2024-08-08 DIAGNOSIS — L700 Acne vulgaris: Secondary | ICD-10-CM | POA: Diagnosis not present

## 2024-08-14 ENCOUNTER — Telehealth: Payer: Self-pay | Admitting: *Deleted

## 2024-08-14 NOTE — Telephone Encounter (Signed)
 Copied from CRM [id number]. Topic: Clinical - Prescription Issue >> Aug 14, 2024  1:20 PM Roselie BROCKS wrote: Reason for CRM: Patient requests a return call

## 2024-08-14 NOTE — Telephone Encounter (Signed)
 Patient is using CBD at home for Hidradenitis suppurantia  and had a drug test at work and would like Dr. Mercer to write a script for it. Per Dr. Mercer she is unable to fill that request.

## 2025-01-14 ENCOUNTER — Ambulatory Visit: Admitting: Dermatology
